# Patient Record
Sex: Male | Born: 1955
Health system: Southern US, Community
[De-identification: ages and names within clinical notes are randomized; demographics above are authoritative.]

## PROBLEM LIST (undated history)

## (undated) DIAGNOSIS — N390 Urinary tract infection, site not specified: Secondary | ICD-10-CM

## (undated) DIAGNOSIS — I10 Essential (primary) hypertension: Secondary | ICD-10-CM

## (undated) DIAGNOSIS — F819 Developmental disorder of scholastic skills, unspecified: Secondary | ICD-10-CM

## (undated) DIAGNOSIS — R569 Unspecified convulsions: Secondary | ICD-10-CM

## (undated) DIAGNOSIS — E039 Hypothyroidism, unspecified: Secondary | ICD-10-CM

## (undated) HISTORY — DX: Essential (primary) hypertension: I10

## (undated) HISTORY — DX: Unspecified convulsions: R56.9

## (undated) HISTORY — DX: Urinary tract infection, site not specified: N39.0

## (undated) HISTORY — PX: TONSILLECTOMY: SUR1361

## (undated) HISTORY — DX: Developmental disorder of scholastic skills, unspecified: F81.9

## (undated) HISTORY — DX: Hypothyroidism, unspecified: E03.9

---

## 2001-06-11 ENCOUNTER — Encounter: Payer: Self-pay | Admitting: Internal Medicine

## 2001-06-21 ENCOUNTER — Encounter: Payer: Self-pay | Admitting: Internal Medicine

## 2001-08-13 ENCOUNTER — Encounter: Payer: Self-pay | Admitting: Internal Medicine

## 2001-09-19 ENCOUNTER — Encounter: Payer: Self-pay | Admitting: Internal Medicine

## 2002-12-25 HISTORY — PX: VASECTOMY: SHX75

## 2005-09-24 DIAGNOSIS — R569 Unspecified convulsions: Secondary | ICD-10-CM

## 2005-09-24 HISTORY — DX: Unspecified convulsions: R56.9

## 2005-09-25 ENCOUNTER — Ambulatory Visit: Payer: Self-pay | Admitting: Internal Medicine

## 2005-10-04 ENCOUNTER — Encounter: Payer: Self-pay | Admitting: Internal Medicine

## 2005-10-27 ENCOUNTER — Encounter: Payer: Self-pay | Admitting: Internal Medicine

## 2006-06-14 ENCOUNTER — Ambulatory Visit: Payer: Self-pay | Admitting: Internal Medicine

## 2006-06-15 ENCOUNTER — Ambulatory Visit: Payer: Self-pay | Admitting: Cardiovascular Disease

## 2007-05-29 ENCOUNTER — Ambulatory Visit: Payer: Self-pay | Admitting: Internal Medicine

## 2007-05-29 DIAGNOSIS — F819 Developmental disorder of scholastic skills, unspecified: Secondary | ICD-10-CM | POA: Insufficient documentation

## 2007-05-29 DIAGNOSIS — E039 Hypothyroidism, unspecified: Secondary | ICD-10-CM | POA: Insufficient documentation

## 2007-05-29 DIAGNOSIS — F8189 Other developmental disorders of scholastic skills: Secondary | ICD-10-CM | POA: Insufficient documentation

## 2007-05-29 LAB — CONVERTED CEMR LAB
Bilirubin Urine: NEGATIVE
Glucose, Urine, Semiquant: NEGATIVE
Ketones, urine, test strip: NEGATIVE
Nitrite: POSITIVE
Protein, U semiquant: NEGATIVE
Specific Gravity, Urine: 1.015
Urobilinogen, UA: NEGATIVE
pH: 5

## 2007-06-06 ENCOUNTER — Encounter (INDEPENDENT_AMBULATORY_CARE_PROVIDER_SITE_OTHER): Payer: Self-pay | Admitting: *Deleted

## 2007-06-06 LAB — CONVERTED CEMR LAB: TSH: 5.5 microintl units/mL (ref 0.35–5.50)

## 2007-06-17 ENCOUNTER — Ambulatory Visit: Payer: Self-pay | Admitting: Internal Medicine

## 2007-06-18 ENCOUNTER — Encounter: Payer: Self-pay | Admitting: Internal Medicine

## 2007-06-18 LAB — CONVERTED CEMR LAB
Bilirubin Urine: NEGATIVE
Ketones, ur: NEGATIVE mg/dL
Nitrite: POSITIVE — AB
Protein, ur: >300 mg/dL — AB
Specific Gravity, Urine: 1.018 (ref 1.005–1.03)
Urine Glucose: NEGATIVE mg/dL
Urobilinogen, UA: 0.2 (ref 0.0–1.0)
pH: 7.5 (ref 5.0–8.0)

## 2007-07-03 ENCOUNTER — Ambulatory Visit: Payer: Self-pay | Admitting: Internal Medicine

## 2007-07-03 LAB — CONVERTED CEMR LAB
Bilirubin Urine: NEGATIVE
Glucose, Urine, Semiquant: NEGATIVE
Ketones, urine, test strip: NEGATIVE
Nitrite: POSITIVE
Protein, U semiquant: >=300
Specific Gravity, Urine: 1.01
Urobilinogen, UA: NEGATIVE
pH: 7.5

## 2007-07-04 ENCOUNTER — Encounter: Payer: Self-pay | Admitting: Internal Medicine

## 2007-07-04 LAB — CONVERTED CEMR LAB: Bacteria, UA: NONE SEEN

## 2007-07-08 ENCOUNTER — Encounter (INDEPENDENT_AMBULATORY_CARE_PROVIDER_SITE_OTHER): Payer: Self-pay | Admitting: *Deleted

## 2007-08-08 ENCOUNTER — Ambulatory Visit: Payer: Self-pay | Admitting: Internal Medicine

## 2007-08-08 LAB — CONVERTED CEMR LAB: WBC, UA: NONE SEEN cells/hpf (ref <3)

## 2007-08-09 ENCOUNTER — Encounter: Payer: Self-pay | Admitting: Internal Medicine

## 2007-08-12 ENCOUNTER — Encounter (INDEPENDENT_AMBULATORY_CARE_PROVIDER_SITE_OTHER): Payer: Self-pay | Admitting: *Deleted

## 2007-10-04 ENCOUNTER — Ambulatory Visit: Payer: Self-pay | Admitting: Internal Medicine

## 2008-03-16 ENCOUNTER — Ambulatory Visit: Payer: Self-pay | Admitting: Internal Medicine

## 2008-03-16 LAB — CONVERTED CEMR LAB
Glucose, Bld: 111 mg/dL
Hemoglobin: 14.5 g/dL

## 2008-03-20 ENCOUNTER — Ambulatory Visit: Payer: Self-pay | Admitting: Internal Medicine

## 2008-03-24 ENCOUNTER — Telehealth: Payer: Self-pay | Admitting: Internal Medicine

## 2008-03-24 LAB — CONVERTED CEMR LAB
BUN: 16 mg/dL
Basophils Absolute: 0.1 K/uL
Basophils Relative: 1.2 % — ABNORMAL HIGH
CO2: 33 meq/L — ABNORMAL HIGH
Calcium: 9.4 mg/dL
Chloride: 105 meq/L
Creatinine, Ser: 1.2 mg/dL
Eosinophils Absolute: 0.3 K/uL
Eosinophils Relative: 4.6 %
GFR calc Af Amer: 83 mL/min
GFR calc non Af Amer: 68 mL/min
Glucose, Bld: 84 mg/dL
HCT: 45 %
Hemoglobin: 15.1 g/dL
Lymphocytes Relative: 28.8 %
MCHC: 33.6 g/dL
MCV: 100.3 fL — ABNORMAL HIGH
Monocytes Absolute: 0.5 K/uL
Monocytes Relative: 7.9 %
Neutro Abs: 3.7 K/uL
Neutrophils Relative %: 57.5 %
Platelets: 247 K/uL
Potassium: 3.9 meq/L
RBC: 4.49 M/uL
RDW: 12.3 %
Sodium: 142 meq/L
TSH: 12.34 u[IU]/mL — ABNORMAL HIGH
WBC: 6.4 10*3/microliter

## 2008-07-03 ENCOUNTER — Encounter (INDEPENDENT_AMBULATORY_CARE_PROVIDER_SITE_OTHER): Payer: Self-pay | Admitting: *Deleted

## 2008-09-04 ENCOUNTER — Ambulatory Visit: Payer: Self-pay | Admitting: Internal Medicine

## 2008-09-04 DIAGNOSIS — D239 Other benign neoplasm of skin, unspecified: Secondary | ICD-10-CM | POA: Insufficient documentation

## 2008-09-05 ENCOUNTER — Encounter: Payer: Self-pay | Admitting: Internal Medicine

## 2008-09-07 ENCOUNTER — Encounter (INDEPENDENT_AMBULATORY_CARE_PROVIDER_SITE_OTHER): Payer: Self-pay | Admitting: *Deleted

## 2008-09-10 ENCOUNTER — Telehealth (INDEPENDENT_AMBULATORY_CARE_PROVIDER_SITE_OTHER): Payer: Self-pay | Admitting: *Deleted

## 2008-09-10 ENCOUNTER — Ambulatory Visit: Payer: Self-pay | Admitting: Internal Medicine

## 2008-09-14 ENCOUNTER — Telehealth (INDEPENDENT_AMBULATORY_CARE_PROVIDER_SITE_OTHER): Payer: Self-pay | Admitting: *Deleted

## 2008-09-14 LAB — CONVERTED CEMR LAB
ALT: 39 units/L (ref 0–53)
AST: 34 units/L (ref 0–37)
BUN: 13 mg/dL (ref 6–23)
CO2: 32 meq/L (ref 19–32)
Calcium: 9.5 mg/dL (ref 8.4–10.5)
Chloride: 103 meq/L (ref 96–112)
Cholesterol: 227 mg/dL (ref 0–200)
Creatinine, Ser: 1.2 mg/dL (ref 0.4–1.5)
Direct LDL: 161.9 mg/dL
Folate: 13.7 ng/mL
GFR calc Af Amer: 83 mL/min
GFR calc non Af Amer: 68 mL/min
Glucose, Bld: 87 mg/dL (ref 70–99)
HDL: 46.9 mg/dL (ref >39.0)
PSA: 3.44 ng/mL (ref 0.10–4.00)
Potassium: 4.1 meq/L (ref 3.5–5.1)
Sodium: 140 meq/L (ref 135–145)
TSH: 6.78 microintl units/mL — ABNORMAL HIGH (ref 0.35–5.50)
Total CHOL/HDL Ratio: 4.8
Triglycerides: 130 mg/dL (ref 0–149)
VLDL: 26 mg/dL (ref 0–40)
Vitamin B-12: 411 pg/mL (ref 211–911)

## 2008-11-23 ENCOUNTER — Encounter (INDEPENDENT_AMBULATORY_CARE_PROVIDER_SITE_OTHER): Payer: Self-pay | Admitting: *Deleted

## 2008-12-15 ENCOUNTER — Ambulatory Visit: Payer: Self-pay | Admitting: Internal Medicine

## 2008-12-24 ENCOUNTER — Encounter (INDEPENDENT_AMBULATORY_CARE_PROVIDER_SITE_OTHER): Payer: Self-pay | Admitting: *Deleted

## 2008-12-24 LAB — CONVERTED CEMR LAB: Fecal Occult Bld: NEGATIVE

## 2009-05-25 ENCOUNTER — Encounter (INDEPENDENT_AMBULATORY_CARE_PROVIDER_SITE_OTHER): Payer: Self-pay | Admitting: *Deleted

## 2009-08-11 ENCOUNTER — Ambulatory Visit: Payer: Self-pay | Admitting: Internal Medicine

## 2009-08-13 ENCOUNTER — Telehealth (INDEPENDENT_AMBULATORY_CARE_PROVIDER_SITE_OTHER): Payer: Self-pay | Admitting: *Deleted

## 2009-08-13 LAB — CONVERTED CEMR LAB: TSH: 3.87 microintl units/mL (ref 0.35–5.50)

## 2010-01-28 ENCOUNTER — Encounter: Payer: Self-pay | Admitting: Internal Medicine

## 2010-04-12 ENCOUNTER — Telehealth (INDEPENDENT_AMBULATORY_CARE_PROVIDER_SITE_OTHER): Payer: Self-pay | Admitting: *Deleted

## 2010-04-13 ENCOUNTER — Encounter (INDEPENDENT_AMBULATORY_CARE_PROVIDER_SITE_OTHER): Payer: Self-pay | Admitting: *Deleted

## 2010-04-25 ENCOUNTER — Encounter (INDEPENDENT_AMBULATORY_CARE_PROVIDER_SITE_OTHER): Payer: Self-pay | Admitting: *Deleted

## 2010-04-25 ENCOUNTER — Ambulatory Visit: Payer: Self-pay | Admitting: Internal Medicine

## 2010-05-02 ENCOUNTER — Ambulatory Visit: Payer: Self-pay | Admitting: Internal Medicine

## 2010-05-04 ENCOUNTER — Telehealth (INDEPENDENT_AMBULATORY_CARE_PROVIDER_SITE_OTHER): Payer: Self-pay | Admitting: *Deleted

## 2010-05-04 LAB — CONVERTED CEMR LAB: TSH: 5.56 microintl units/mL — ABNORMAL HIGH (ref 0.35–5.50)

## 2010-06-13 ENCOUNTER — Ambulatory Visit: Payer: Self-pay | Admitting: Internal Medicine

## 2010-06-15 LAB — CONVERTED CEMR LAB: TSH: 2.18 microintl units/mL (ref 0.35–5.50)

## 2010-06-30 ENCOUNTER — Ambulatory Visit: Payer: Self-pay | Admitting: Internal Medicine

## 2011-01-02 ENCOUNTER — Encounter: Payer: Self-pay | Admitting: Internal Medicine

## 2011-01-22 LAB — CONVERTED CEMR LAB
Bilirubin Urine: NEGATIVE
Glucose, Urine, Semiquant: NEGATIVE
Ketones, urine, test strip: NEGATIVE
Nitrite: NEGATIVE
Protein, U semiquant: NEGATIVE
Specific Gravity, Urine: 1.02
Urobilinogen, UA: 0.2
pH: 6

## 2011-01-24 NOTE — Letter (Signed)
Summary: [facility]  [facility]   Imported By: Lanelle Bal 07/05/2010 11:29:40  _____________________________________________________________________  External Attachment:    Type:   Image     Comment:   External Document

## 2011-01-24 NOTE — Letter (Signed)
Summary: Results Follow up Letter  [place] [facility] [place]  [address] [place], [place] [address]   Phone: [phone]  Fax: [phone]    08/12/2007 MRN: [id number]  Dean Dennis [address] [place], [place]  [address]  Dear Mr. Cadman,  The following are the results of your recent test(s):  Test         Result    Pap Smear:        Normal _____  Not Normal _____ Comments: ______________________________________________________ Cholesterol: LDL(Bad cholesterol):         Your goal is less than:         HDL (Good cholesterol):       Your goal is more than: Comments:  ______________________________________________________ Mammogram:        Normal _____  Not Normal _____ Comments:  ___________________________________________________________________ Hemoccult:        Normal _____  Not normal _______ Comments:    _____________________________________________________________________ Other Tests: Your urine culture was normal.  We routinely do not discuss normal results over the telephone.  If you desire a copy of the results, or you have any questions about this information we can discuss them at your next office visit.   Sincerely,

## 2011-01-24 NOTE — Letter (Signed)
Summary: Results Follow up Letter  [place] [facility]  [address] [place], [place] [address]   Phone: [phone]  Fax: [phone]    12/24/2008 MRN: [id number]  Dean Dennis [address] [place], [place]  [address]  Dear Mr. Bovey,  The following are the results of your recent test(s):  Test         Result    Pap Smear:        Normal _____  Not Normal _____ Comments: ______________________________________________________ Cholesterol: LDL(Bad cholesterol):         Your goal is less than:         HDL (Good cholesterol):       Your goal is more than: Comments:  ______________________________________________________ Mammogram:        Normal _____  Not Normal _____ Comments:  ___________________________________________________________________ Hemoccult:        Normal ___x__  Not normal _______ Comments:    _____________________________________________________________________ Other Tests:    We routinely do not discuss normal results over the telephone.  If you desire a copy of the results, or you have any questions about this information we can discuss them at your next office visit.   Sincerely,

## 2011-01-24 NOTE — Miscellaneous (Signed)
Summary: Health Care Power of [facility] Power of Attorney   Imported By: Lanelle Bal 07/05/2010 11:33:31  _____________________________________________________________________  External Attachment:    Type:   Image     Comment:   External Document

## 2011-01-24 NOTE — Progress Notes (Signed)
Summary: not taking synthroid  Phone Note Outgoing Call   Details for Reason: **LAB RESULTS - SEE APPEND** -cbc ok -sugar ok -need to increase synthroid from 50-100 -f/u with dr Drue Novel in 3 mos Summary of Call: spoke with mom - patient has not been taking synthroid x 6 months - do you want him to start back on the ? [facility] [place] ..................................................................Marland KitchenShary Decamp  March 24, 2008 11:53 AM yes, once daily . Need medicine for the rest of his life OV 3 months....................................................................Jose E. Paz MD  March 24, 2008 2:26 PM   Initial call taken by: Nolon Rod. Paz MD,  March 24, 2008 2:25 PM      Prescriptions: SYNTHROID 50 MCG  TABS (LEVOTHYROXINE SODIUM) 1 by mouth qd  #30 x 3   Entered by:   Shary Decamp   Authorized by:   Nolon Rod. Paz MD   Signed by:   Shary Decamp on 03/24/2008   Method used:   Electronically sent to ...       [facility]  [address] [id number]*       [address]       [place], [place]  [address]       Ph: [phone] or [phone]       Fax: [phone]   RxID:   [id number]

## 2011-01-24 NOTE — Progress Notes (Signed)
Summary: lab results  Phone Note Outgoing Call Call back at Home Phone [phone]   Reason for Call: Discuss lab or test results Details for Reason: if he has not been compliant with Synthroid, he needs to be compliant otherwise increase Synthroid from 100 micrograms to 125 micrograms daily TSH in two months Signed by [facility] E. Paz MD on 05/04/2010 at 2:00 PM  Summary of Call: left message on machine for pt to return call spoke with pt sister *teresa" [phone] -- she will check with pt Shary Decamp  May 04, 2010 4:48 PM Patient has only been taking medication days a week, advised pt to take everyday......discussed with sister.  Advised recheck in 6 weeks Shary Decamp  May 05, 2010 11:34 AM

## 2011-01-24 NOTE — Letter (Signed)
Summary: [facility]  [address] [place], [place] [address]   Phone: [phone]  Fax: [phone]    [facility] [address] [place], [place] [address]     To: [facility] [employer]  From: Willow Ora, MD  07/03/2008  Patient's Name: Lenna Gilford Date of Birth: 1956/06/11   The above patient is physically or mentally unable to perform jury duty.   Please feel free to contact our office if you have any questions [phone]).    Sincerely,     Willow Ora, MD

## 2011-01-24 NOTE — Assessment & Plan Note (Signed)
Summary: roa//lch   Vital Signs:  Patient profile:   55 year old male Height:      69 inches Weight:      169.2 pounds BMI:     25.08 Pulse rate:   66 / minute BP sitting:   122 / 84  Vitals Entered By: Shary Decamp (May 02, 2010 11:14 AM) CC: rov   History of Present Illness: ROV no concerns   Current Medications (verified): 1)  Synthroid 100 Mcg Tabs (Levothyroxine Sodium) .... Take One Tablet Daily 2)  Aspirin 81 Mg Tbec (Aspirin) .Marland Kitchen.. 1  A  Day  Allergies (verified): No Known Drug Allergies  Past History:  Past Medical History: LEARNING DISABILITY--evaluated in the past by psych  "borderline functioning range of intelligence" HYPOTHYROIDISM (ICD-244.9) 09-2005 ?SZ--saw neuro, they didn't think he had a sz, (-) EEG  Past Surgical History: Reviewed history from 05/29/2007 and no changes required. Tonsillectomy  Social History: Reviewed history from 05/29/2007 and no changes required. Single lost his mother aprox 2 years ago , lives by himself, sisters check on him has 2 sisters: [name] [phone]), [name] doesn't work ADL-- cooks his own meals, does drive plays the banjo  Review of Systems       good medication compliance w/ throid medicine , needs 1 year rf saw urology d/t h/o a UTI 2 years ago, told he was ok lost mother, see SH denies anxiety depression  Physical Exam  General:  alert and well-developed.   Lungs:  normal respiratory effort, no intercostal retractions, no accessory muscle use, and normal breath sounds.   Heart:  normal rate, regular rhythm, no murmur, and no gallop.   Psych:  not anxious appearing and not depressed appearing.     Impression & Recommendations:  Problem # 1:  HYPOTHYROIDISM (ICD-244.9) due for labs , will need a 1 year refill when due   His updated medication list for this problem includes:    Synthroid 100 Mcg Tabs (Levothyroxine sodium) .Marland Kitchen... Take one tablet daily  Orders: Venipuncture ([address]) TLB-TSH  (Thyroid Stimulating Hormone) (84443-TSH)  Complete Medication List: 1)  Synthroid 100 Mcg Tabs (Levothyroxine sodium) .... Take one tablet daily 2)  Aspirin 81 Mg Tbec (Aspirin) .Marland Kitchen.. 1  a  day  Patient Instructions: 1)  Please schedule a follow-up appointment in 4 months .

## 2011-01-24 NOTE — Letter (Signed)
Summary: Primary Care Appointment Letter  [place] [facility]  [address] [place], [place] [address]   Phone: [phone]  Fax: [phone]    04/13/2010 MRN: [id number]  Dean Dennis [address] [place], [place]  [address]  Dear [name],   Your Primary Care Physician [place] E. Paz MD has indicated that:    __X_____it is time to schedule an appointment.    _______you missed your appointment on______ and need to call and          reschedule.    __X_____you need to have lab work done.    _______you need to schedule an appointment discuss lab or test results.    _______you need to call to reschedule your appointment that is                       scheduled on _________.     Please call our office as soon as possible. Our phone number is [phone]________. Please press option 1. Our office is open 8a-12noon and 1p-5p, Monday through Friday.     Thank you,    [place] Primary Care Scheduler

## 2011-01-24 NOTE — Letter (Signed)
Summary: Primary Care Consult Scheduled Letter  [place] [facility]  [address] [place], [place] [address]   Phone: [phone]  Fax: [phone]      09/07/2008 MRN: [id number]  Dean Dennis [address] [place], [place]  [address]    Dear Mr. Borge,      We have scheduled an appointment for you.  At the recommendation of Dr.Paz, we have scheduled you a consult with Dr. Charlton Haws on September 28th at 12:00.  Their address is [address]. The office phone number is [phone].  If this appointment day and time is not convenient for you, please feel free to call the office of the doctor you are being referred to at the number listed above and reschedule the appointment.     It is important for you to keep your scheduled appointments. We are here to make sure you are given good patient care. If you have questions or you have made changes to your appointment, please notify us at  [phone], ask for Tiffany.    Thank you,  Patient Care Coordinator [place] [facility]

## 2011-01-24 NOTE — Assessment & Plan Note (Signed)
Summary: roa after medicine//lch   Vital Signs:  Patient profile:   55 year old male Weight:      165.25 pounds Pulse rate:   57 / minute Pulse rhythm:   regular BP sitting:   134 / 86  (left arm) Cuff size:   large  Vitals Entered By: Army Fossa CMA (June 30, 2010 10:59 AM) CC: Pt here to follow up on meds. Has had recent TSH.  Comments Pt would like a year supply for his synthroid   History of Present Illness: here w/  sister Campbell Lerner doing well needs refill  also , she is trying to get guardianship of [place], needs records from years ago when he was eval for learning disability  Allergies (verified): No Known Drug Allergies  Past History:  Past Medical History: POA: sisters [name] and [name] LEARNING DISABILITY--evaluated in the past by psych  "borderline functioning range of intelligence" HYPOTHYROIDISM (ICD-244.9) 09-2005 ?SZ--saw neuro, they didn't think he had a sz, (-) EEG  Past Surgical History: Reviewed history from 05/29/2007 and no changes required. Tonsillectomy  Family History: MI-- F age 42 prostate ca--no colon ca--no DM--no lung cancer-- M , remote smoker   Social History: Single lost his mother aprox 2 years ago , lives by himself, sisters check on him has 2 sisters: [name] [phone]), Campbell Lerner [phone]) doesn't work ADL-- cooks his own meals, does drive plays the banjo  Review of Systems       good compliance with Synthroid  Physical Exam  General:  alert and well-developed.   Neurologic:  at baseline Psych:  at baseline   Impression & Recommendations:  Problem # 1:  HYPOTHYROIDISM (ICD-244.9) well controlled at present, recheck and return to the office His updated medication list for this problem includes:    Synthroid 100 Mcg Tabs (Levothyroxine sodium) .Marland Kitchen... Take one tablet daily  Labs Reviewed: TSH: 2.18 (06/13/2010)    Chol: 227 (09/10/2008)   HDL: 46.9 (09/10/2008)   LDL: DEL (09/10/2008)   TG: 130  (09/10/2008)  Problem # 2:  LEARNING DISABILITY (ICD-315.2) the sisters like guardianship  of Ranvir, he is now seeing a psychologist and they need to review previous evaluations. Paper chart is reviewed, we found records from 2002. They were copied and provided to [facility], will also scann them   Problem # 3:  time spent more than 15 minutes mostly due to counseling and coordinating his care (getting old records)  Complete Medication List: 1)  Synthroid 100 Mcg Tabs (Levothyroxine sodium) .... Take one tablet daily 2)  Aspirin 81 Mg Tbec (Aspirin) .Marland Kitchen.. 1  a  day  Patient Instructions: 1)  please came back in October, fasting, will do your physical exam Prescriptions: SYNTHROID 100 MCG TABS (LEVOTHYROXINE SODIUM) take one tablet daily  #90 x 3   Entered and Authorized by:   Nolon Rod. Satara Virella MD   Signed by:   Nolon Rod. Maveryk Renstrom MD on 06/30/2010   Method used:   Electronically to        [id number] (retail)       [address]       [place], [place]  [address]       Ph: [phone] or [phone]       Fax: [phone]   RxID:   [id number]

## 2011-01-24 NOTE — Progress Notes (Signed)
Summary: lab results  Phone Note Outgoing Call Call back at [facility] Phone [phone]   Details for Reason: LAB RESULTS: discuss with patient's family cholesterol is moderately elevated, diet and exercise recommended TSH is slightly elevated, needs more centrally.  The patient is currently on 50  micrograms, increase to , check a TSH in 6 weeks PSA normal, no previous PSAs rest of the labs are normal Signed by [facility] E. Paz MD on 09/13/2008 at 11:10 AM Summary of Call: new synthroid prescription called to pharmacy left message on machine for mom (hilda) to return call .........Marland KitchenShary Decamp  September 14, 2008 3:18 PM  discussed with mom ..........Marland KitchenShary Decamp  September 15, 2008 9:15 AM      New/Updated Medications: SYNTHROID 75 MCG TABS (LEVOTHYROXINE SODIUM) 1 by mouth QD   Prescriptions: SYNTHROID 75 MCG TABS (LEVOTHYROXINE SODIUM) 1 by mouth QD  #30 x 2   Entered by:   Shary Decamp   Authorized by:   Nolon Rod. Paz MD   Signed by:   Shary Decamp on 09/14/2008   Method used:   Electronically to        [facility] # [id number]* (retail)       [address]       [place], [place]  [address]       Ph: [phone] or [phone]       Fax: [phone]   RxID:   [id number]

## 2011-01-24 NOTE — Progress Notes (Signed)
Summary: tsh results  Phone Note Outgoing Call Call back at [facility] Phone [phone]   Summary of Call: TSH RESULTS: d/w patient iand family: ncreaase dose from 75 to once daily  TSH in 6 weeks (or on RTC)   Signed by Erwin E. Paz MD on 08/12/2009 at 1:37 PM  Follow-up for Phone Call        lmom for pt to return call Shary Decamp  August 13, 2009 5:23 PM  discussed with pt Shary Decamp  August 16, 2009 2:46 PM

## 2011-01-24 NOTE — Letter (Signed)
Summary: Generic Letter  [place] [facility]  [address] [place], [place] [address]   Phone: [phone]  Fax: [phone]    07/08/2007  Dean Dennis [address] [place], [place]  [address]   Dear [name],   Dr. Drue Novel has requested that you repeat your labs in 4 weeks.  I have scheduled you a lab appointment for Thursday 8/14  @ 11am .  If this time is not convenient please call [phone] and speak to our scheduler to reschedule this appointment.       Sincerely,   Shary Decamp [place] [facility]

## 2011-01-24 NOTE — Consult Note (Signed)
Summary: [facility]  [facility]   Imported By: Lanelle Bal 07/05/2010 11:25:13  _____________________________________________________________________  External Attachment:    Type:   Image     Comment:   External Document

## 2011-01-24 NOTE — Letter (Signed)
Summary: Results Follow up Letter  [place] [facility]  [address] [place], [place] [address]   Phone: [phone]  Fax: [phone]    06/06/2007 MRN: [id number]   [facility]  Dean Dennis [address] [place], [place]  [address]    Dear [name],  The following are the results of your recent test(s):  Test         Result    Pap Smear:        Normal _____  Not Normal _____ Comments: ______________________________________________________ Cholesterol: LDL(Bad cholesterol):         Your goal is less than:         HDL (Good cholesterol):       Your goal is more than: Comments:  ______________________________________________________ Mammogram:        Normal _____  Not Normal _____ Comments:  ___________________________________________________________________ Hemoccult:        Normal _____  Not normal _______ Comments:    _____________________________________________________________________ Other Tests: Please come back in 4 weeks to recheck your urine culture.  please return in 4 months to recheck your thyroid.  Continue your same dose of thyroid medication.  Appointment information enclosed.  If this appointment time is not good please call our scheduler to reschedule your appointments.   We routinely do not discuss normal results over the telephone.  If you desire a copy of the results, or you have any questions about this information we can discuss them at your next office visit.   Sincerely,

## 2011-01-24 NOTE — Letter (Signed)
Summary: [facility]  [address] [place], [place] [address]   Phone: [phone]  Fax: [phone]    To: [facility]  From: Willow Ora, MD  07/03/2008  Patient's Name: Lenna [place] Date of Birth: 06/19/1956   The above patient is physically or mentally unable to perform jury duty.  Feel free to contact our office with any questions.    Sincerely,    Willow Ora, MD    Appended Document: [facility] [facility] Letter mailed to pt

## 2011-01-24 NOTE — Consult Note (Signed)
Summary: [facility]  [facility]   Imported By: Lanelle Bal 07/05/2010 11:30:55  _____________________________________________________________________  External Attachment:    Type:   Image     Comment:   External Document

## 2011-01-24 NOTE — Letter (Signed)
Summary: Primary Care Appointment Letter  [place] [facility]  [address] [place], [place] [address]   Phone: [phone]  Fax: [phone]    05/25/2009 MRN: [id number]  Dean Dennis [address] [place], [place]  [address]  Dear [name],   Your Primary Care Physician Rossie E. Paz MD has indicated that:    _______it is time to schedule an appointment.    _______you missed your appointment on______ and need to call and          reschedule.    ___X____you need to have lab work done.    _______you need to schedule an appointment discuss lab or test results.    _______you need to call to reschedule your appointment that is                       scheduled on _________.     Please call our office as soon as possible. Our phone number is [phone]          _________. Please press option 1. Our office is open 8a-12noon and 1p-5p, Monday through Friday.     Thank you,    [place] Primary Care Scheduler

## 2011-01-24 NOTE — Assessment & Plan Note (Signed)
Summary: u/a ck/alr  Nurse Visit   Prior Medications: SYNTHROID 50 MCG  TABS (LEVOTHYROXINE SODIUM) 1 by mouth qd CIPRO 500 MG TABS (CIPROFLOXACIN HCL) one p.o. b.i.d. Current Allergies: No known allergies  Laboratory Results   Urine Tests   Date/Time Reported:          Orders Added: 1)  T-Culture, Urine [[id number]] 2)  T-Urinalysis [[id number]]

## 2011-01-24 NOTE — Assessment & Plan Note (Signed)
Summary: shakey if doesnt eat.cbs   Vital Signs:  Patient Profile:   55 Years Old Male Weight:      168 pounds Temp:     99 degrees F oral BP sitting:   142 / 80  Vitals Entered By: Shary Decamp (March 16, 2008 11:31 AM)                 Chief Complaint:  lightheaded x 2 days; no other sxs.  History of Present Illness: got lightheaded, dizzy, irritable (anxious) ; sx lasted until he ate some crackers similar episodes x 2  over the last few months? low sugar?     Current Allergies: No known allergies   Past Medical History:    Reviewed history from 05/29/2007 and no changes required:              LEARNING DISABILITY (ICD-315.2)       HYPOTHYROIDISM (ICD-244.9)          Social History:    Reviewed history from 05/29/2007 and no changes required:       Single       lives with his mother    Review of Systems       denies CP-palpitations No recent LOC no tongue bite or seizures   Physical Exam  General:     alert and well-developed.   Lungs:     normal respiratory effort, no intercostal retractions, no accessory muscle use, and normal breath sounds.   Heart:     normal rate, regular rhythm, and no murmur.   Neurologic:     Alert--cooperative--coherentstrength normal in all extremities and gait normal.  No tremors Psych:     not anxious appearing and not depressed appearing.      Impression & Recommendations:  Problem # 1:  DIZZINESS (ICD-780.4) episodes of dizzines pt's mother  concern about his sugar  (CBG normal) Question of SZs when he was a teen, used to be on phenobarbital Rec-- observe, labs, call if worse Will discuss w/ mom--will ask her to keep [place] informed anxiety? Orders: TLB-TSH (Thyroid Stimulating Hormone) (84443-TSH) TLB-BMP (Basic Metabolic Panel-BMET) (80048-METABOL)   Problem # 2:  HYPOTHYROIDISM (ICD-244.9)  His updated medication list for this problem includes:    Synthroid 50 Mcg Tabs (Levothyroxine sodium) .Marland Kitchen... 1 by  mouth qd  Orders: TLB-CBC Platelet - w/Differential (85025-CBCD) Venipuncture ([address])   Complete Medication List: 1)  Synthroid 50 Mcg Tabs (Levothyroxine sodium) .Marland Kitchen.. 1 by mouth qd   Patient Instructions: 1)  Please schedule a follow-up appointment in 3 months.    ] Laboratory Results   Blood Tests     Glucose (random): 111 mg/dL   (Normal Range: 09-811)  CBC HGB:  14.5 g/dL   (Normal Range: 91.4-78.2 in Males, 12.0-15.0 in Females)    Appended Document: shakey if doesnt eat.cbs spoke with mom patient did not have lab work when he was here in the office patient will return for lab work mom will keep [place] posted

## 2011-01-24 NOTE — Progress Notes (Signed)
Summary: due ov  Phone Note Outgoing Call   Summary of Call: I refilled his meds x 1 only.  Due office visit & labs Shary Decamp  April 12, 2010 12:59 PM     Additional Follow-up for Phone Call Additional follow up Details #2::    LMTCB Follow-up by: Barb Merino,  April 12, 2010 1:04 PM  Additional Follow-up for Phone Call Additional follow up Details #3:: Details for Additional Follow-up Action Taken: MAILED A LETTER Additional Follow-up by: Barb Merino,  April 13, 2010 3:22 PM

## 2011-01-24 NOTE — Miscellaneous (Signed)
Summary: Continuing [employer] of [employer] Power of Attorney   Imported By: Lanelle Bal 07/05/2010 11:35:15  _____________________________________________________________________  External Attachment:    Type:   Image     Comment:   External Document

## 2011-01-24 NOTE — Progress Notes (Signed)
Summary: urine culture results  Phone Note Outgoing Call   Details for Reason: URINE CULTURE RESULTS: UA showed pyuria  although urine culture is essentially negative, the patient has urinary symptoms and pyuria.  Will prescribe Cipro 500 mg b.i.d. for 10 days. Proceed with urology referral notify the patient Signed by [facility] E. Paz MD on 09/10/2008 at 1:17 PM  Summary of Call: DISCUSSED WITH MOM REFERRAL DONE RX CALLED ...........Marland KitchenShary Decamp  September 10, 2008 1:18 PM     New/Updated Medications: CIPRO 500 MG TABS (CIPROFLOXACIN HCL) bid   Prescriptions: CIPRO 500 MG TABS (CIPROFLOXACIN HCL) bid  #20 x 0   Entered by:   Shary Decamp   Authorized by:   Nolon Rod. Paz MD   Signed by:   Shary Decamp on 09/10/2008   Method used:   Electronically to        [facility] # [id number]* (retail)       [address]       [place], [place]  [address]       Ph: [phone] or [phone]       Fax: [phone]   RxID:   [id number]

## 2011-01-24 NOTE — Assessment & Plan Note (Signed)
Summary: CPX//vgj   Vital Signs:  Patient Profile:   55 Years Old Male Weight:      174 pounds Pulse rate:   66 / minute BP sitting:   136 / 80  Vitals Entered By: Shary Decamp (September 04, 2008 1:17 PM)                 Chief Complaint:  Likes a CPX  -- NEEDS THYROID CHECKED/CHECK MOLES; PT STATES THAT HE FEELS BETTER WHEN HE DOESN'T TAKE HIS THYROID MEDICATION -- FEELS SLUGGISH & HAS GAINED WT.  History of Present Illness: here w/ sister they like a yearly check and have several issues...  CHECK MOLES PT STATES THAT HE FEELS BETTER WHEN HE DOESN'T TAKE HIS THYROID MEDICATION FEELS SLUGGISH --off/on x  a while HAS GAINED WT.-- states doesn't exercise, but active; diet not healthy       Updated Prior Medication List: SYNTHROID 50 MCG  TABS (LEVOTHYROXINE SODIUM) 1 by mouth qd  Current Allergies (reviewed today): No known allergies   Past Medical History:    Reviewed history from 03/16/2008 and no changes required:       LEARNING DISABILITY--evaluated in the past by psych  "borderline functioning rangeof intelligence"       HYPOTHYROIDISM (ICD-244.9)       09-2005 ?SZ--saw neuro, they didn't think he had a sz, (-) EEG         Past Surgical History:    Reviewed history from 05/29/2007 and no changes required:       Tonsillectomy   Family History:    Reviewed history and no changes required:       MI-- F age 12       prostate ca--no       colon ca--no       DM--no  Social History:    Reviewed history from 05/29/2007 and no changes required:       Single       lives with his mother   Risk Factors: Tobacco use:  never Alcohol use:  no   Review of Systems  CV      Denies chest pain or discomfort and swelling of feet.  Resp      Denies cough and shortness of breath.  GI      Denies bloody stools, diarrhea, and nausea.  GU      Denies hematuria, urinary frequency, and urinary hesitancy.  Psych      Denies anxiety, depression, easily  angered, and easily tearful.   Physical Exam  General:     alert and well-developed.   Neck:     no thyromegaly and normal carotid upstroke.   Lungs:     normal respiratory effort, no intercostal retractions, no accessory muscle use, and normal breath sounds.   Heart:     normal rate, regular rhythm, no murmur, and no gallop.   Abdomen:     soft, non-tender, no hepatomegaly, and no splenomegaly.   Rectal:     No external abnormalities noted. Normal sphincter tone. No rectal masses or tenderness. hemocult neg Prostate:     Prostate gland firm and smooth, no enlargement, nodularity, tenderness, mass, asymmetry or induration. Extremities:     no pretibial edema bilaterally     Impression & Recommendations:  Problem # 1:  HYPOTHYROIDISM (ICD-244.9) labs His updated medication list for this problem includes:    Synthroid 50 Mcg Tabs (Levothyroxine sodium) .Marland Kitchen... 1 by mouth qd   Problem # 2:  HEALTH  SCREENING (ICD-V70.0) diet-exercise labs EKG--sinus brady Td--today Colonoscopy Vs. iFOB: reviewed w/ pt. Will provide iFOB  in case he decide to doit, otherwise, he will call and ask for a colonoscopy  flu shot this winter recommended ASA due to FH of CAD Orders: EKG w/ Interpretation (93000)   Problem # 3:  WEIGHT GAIN (ICD-783.1) diet- exercise labs  Problem # 4:  UTI (ICD-599.0) E. Coli UTI 6-07 now  UA c/w UTI pt is Asx  treat w/  culture referal to urology due to asx UTIs Orders: UA Dipstick w/o Micro (manual) ([address]) T-Culture, Urine ([id number]) T-Urine Microscopic ([id number]) Urology Referral (Urology)   Problem # 5:  MOLE (ICD-216.9) referal Orders: Dermatology Referral (Derma)   Complete Medication List: 1)  Synthroid 50 Mcg Tabs (Levothyroxine sodium) .Marland Kitchen.. 1 by mouth qd 2)  Aspirin 81 Mg Tbec (Aspirin) .Marland Kitchen.. 1  a  day  Other Orders: Tetanus Toxoid w/Dx [phone]) Admin 1st Vaccine ([address]846)   Patient Instructions: 1)  came back fasting: 2)    BMP AST ALT--Weight gain 3)   FLP PSA   B12 / Folic Acid--V70 4)  TSH--hypothyroid 5)  Please schedule a follow-up appointment in 4 months.   ]  Tetanus/Td Vaccine    Vaccine Type: Td    Site: right deltoid    Mfr: GlaxoSmithKline    Dose: 0.5 ml    Route: IM    Given by: Shary Decamp    Exp. Date: 11/28/2010    Lot #: [id number]  Laboratory Results   Urine Tests    Routine Urinalysis   Glucose: negative   (Normal Range: Negative) Bilirubin: negative   (Normal Range: Negative) Ketone: negative   (Normal Range: Negative) Spec. Gravity: 1.020   (Normal Range: 1.003-1.035) Blood: large   (Normal Range: Negative) pH: 6.0   (Normal Range: 5.0-8.0) Protein: negative   (Normal Range: Negative) Urobilinogen: 0.2   (Normal Range: 0-1) Nitrite: negative   (Normal Range: Negative) Leukocyte Esterace: moderate   (Normal Range: Negative)

## 2011-01-24 NOTE — Miscellaneous (Signed)
Summary: Living Will  Living Will   Imported By: Lanelle Bal 07/05/2010 11:36:09  _____________________________________________________________________  External Attachment:    Type:   Image     Comment:   External Document

## 2011-01-24 NOTE — Letter (Signed)
Summary: [facility] No Show Letter  [facility] [place]  [address] [place], [place] [address]   Phone: [phone]  Fax: [phone]    04/25/2010 MRN: [id number]  Dean Dennis [address] [place], [place]  [address]   Dear [name],   Our records indicate that you missed your scheduled appointment with Dr. Drue Novel on 04/25/10.  Please contact this office to reschedule your appointment as soon as possible.  It is important that you keep your scheduled appointments with your physician, so we can provide you the best care possible.  Please be advised that there may be a charge for "no show" appointments.    Sincerely,   [facility]

## 2011-01-26 NOTE — Letter (Signed)
Summary: dx UTI, increased PSA--- [facility]   Imported By: Lanelle Bal 01/11/2011 12:34:26  _____________________________________________________________________  External Attachment:    Type:   Image     Comment:   External Document

## 2011-06-19 ENCOUNTER — Other Ambulatory Visit: Payer: Self-pay | Admitting: Internal Medicine

## 2011-06-19 NOTE — Telephone Encounter (Signed)
Pt is due for appt before any additional refills will be given.

## 2011-06-22 NOTE — Telephone Encounter (Signed)
LMOM for patient to call and schedule a followup appt to see Dr Drue Novel

## 2011-06-23 NOTE — Telephone Encounter (Signed)
Has appt for 7/3 at 9:15

## 2011-06-27 ENCOUNTER — Ambulatory Visit (INDEPENDENT_AMBULATORY_CARE_PROVIDER_SITE_OTHER): Payer: Medicare Other | Admitting: Internal Medicine

## 2011-06-27 ENCOUNTER — Encounter: Payer: Self-pay | Admitting: Internal Medicine

## 2011-06-27 ENCOUNTER — Other Ambulatory Visit: Payer: Self-pay | Admitting: Internal Medicine

## 2011-06-27 DIAGNOSIS — E785 Hyperlipidemia, unspecified: Secondary | ICD-10-CM

## 2011-06-27 DIAGNOSIS — E039 Hypothyroidism, unspecified: Secondary | ICD-10-CM

## 2011-06-27 DIAGNOSIS — Z0000 Encounter for general adult medical examination without abnormal findings: Secondary | ICD-10-CM | POA: Insufficient documentation

## 2011-06-27 NOTE — Patient Instructions (Signed)
CAME BACK IN 4 MONTHS, FASTING FOR A PHYSICAL EXAM

## 2011-06-27 NOTE — Assessment & Plan Note (Signed)
Last physical exam 2009, recommended to return to the office in 4 months for a physical.

## 2011-06-27 NOTE — Progress Notes (Signed)
  Subjective:    Patient ID: Dean Dennis, male    DOB: [birth year], 55 y.o.   MRN: [id number]  HPI Routine office visit, no complains, needs his Synthroid refill  Past Medical History  Diagnosis Date  . Learning disability     evaluated in the past by psych "borderline functioning range of inttelligence  . Hypothyroidism   . Seizure 09/2005    saw neuro, they didnt think he had sz (-) EEG    Past Surgical History  Procedure Date  . Tonsillectomy     Family History:       MI-- F age 39       prostate ca--no       colon ca--no       DM--no   Social History: Single lost his mother aprox 2009 , lives by himself, sisters check on him: they are his POA sisters: [name] [phone]), Campbell Lerner [phone]) doesn't work ADL-- cooks his own meals, does drive plays the banjo  Review of Systems Good medication compliance with Synthroid Denies any nausea, vomiting, diarrhea. No blood in the stools    Objective:   Physical Exam Alert, no apparent distress, cooperative. Lungs: Clear to auscultation bilaterally Cardiovascular: Regular rhythm without a murmur Extremities: No edema       Assessment & Plan:

## 2011-06-27 NOTE — Assessment & Plan Note (Signed)
Good medication compliance, labs 

## 2011-06-29 ENCOUNTER — Other Ambulatory Visit (INDEPENDENT_AMBULATORY_CARE_PROVIDER_SITE_OTHER): Payer: Medicare Other

## 2011-06-29 DIAGNOSIS — E039 Hypothyroidism, unspecified: Secondary | ICD-10-CM

## 2011-06-29 DIAGNOSIS — E785 Hyperlipidemia, unspecified: Secondary | ICD-10-CM

## 2011-06-29 LAB — LIPID PANEL
Cholesterol: 169 mg/dL (ref 0–200)
HDL: 46.1 mg/dL (ref >39.00)
LDL Cholesterol: 99 mg/dL (ref 0–99)
Total CHOL/HDL Ratio: 4
Triglycerides: 119 mg/dL (ref 0.0–149.0)
VLDL: 23.8 mg/dL (ref 0.0–40.0)

## 2011-06-29 LAB — TSH: TSH: 2.01 u[IU]/mL (ref 0.35–5.50)

## 2011-06-29 NOTE — Progress Notes (Signed)
Labs only

## 2011-07-04 ENCOUNTER — Telehealth: Payer: Self-pay | Admitting: *Deleted

## 2011-07-04 NOTE — Telephone Encounter (Signed)
Message copied by Leanne Lovely on Tue Jul 04, 2011 10:47 AM ------      Message from: Dean Dennis      Created: Mon Jul 03, 2011  8:21 PM       Advise patient:      Thyroid ok, continue same synthroid      FLP wnl      Good results

## 2011-07-04 NOTE — Telephone Encounter (Signed)
Pt is aware.  

## 2011-07-21 ENCOUNTER — Other Ambulatory Visit: Payer: Self-pay | Admitting: Internal Medicine

## 2011-10-31 ENCOUNTER — Ambulatory Visit (INDEPENDENT_AMBULATORY_CARE_PROVIDER_SITE_OTHER): Payer: Medicare Other | Admitting: Internal Medicine

## 2011-10-31 ENCOUNTER — Encounter: Payer: Self-pay | Admitting: Internal Medicine

## 2011-10-31 DIAGNOSIS — Z23 Encounter for immunization: Secondary | ICD-10-CM

## 2011-10-31 DIAGNOSIS — Z87440 Personal history of urinary (tract) infections: Secondary | ICD-10-CM | POA: Insufficient documentation

## 2011-10-31 DIAGNOSIS — E039 Hypothyroidism, unspecified: Secondary | ICD-10-CM

## 2011-10-31 DIAGNOSIS — Z0000 Encounter for general adult medical examination without abnormal findings: Secondary | ICD-10-CM

## 2011-10-31 DIAGNOSIS — Z79899 Other long term (current) drug therapy: Secondary | ICD-10-CM

## 2011-10-31 LAB — CBC WITH DIFFERENTIAL/PLATELET
Basophils Absolute: 0 10*3/uL (ref 0.0–0.1)
Basophils Relative: 0.8 % (ref 0.0–3.0)
Eosinophils Absolute: 0.2 10*3/uL (ref 0.0–0.7)
Eosinophils Relative: 3.7 % (ref 0.0–5.0)
HCT: 42.9 % (ref 39.0–52.0)
Hemoglobin: 14.9 g/dL (ref 13.0–17.0)
Lymphocytes Relative: 28.6 % (ref 12.0–46.0)
Lymphs Abs: 1.8 10*3/uL (ref 0.7–4.0)
MCHC: 34.7 g/dL (ref 30.0–36.0)
MCV: 101.6 fl — ABNORMAL HIGH (ref 78.0–100.0)
Monocytes Absolute: 0.5 10*3/uL (ref 0.1–1.0)
Monocytes Relative: 7.5 % (ref 3.0–12.0)
Neutro Abs: 3.7 10*3/uL (ref 1.4–7.7)
Neutrophils Relative %: 59.4 % (ref 43.0–77.0)
Platelets: 222 10*3/uL (ref 150.0–400.0)
RBC: 4.22 Mil/uL (ref 4.22–5.81)
RDW: 13.1 % (ref 11.5–14.6)
WBC: 6.3 10*3/uL (ref 4.5–10.5)

## 2011-10-31 LAB — TSH: TSH: 2.55 u[IU]/mL (ref 0.35–5.50)

## 2011-10-31 NOTE — Assessment & Plan Note (Signed)
Sees  urology routinely, q 6 months

## 2011-10-31 NOTE — Progress Notes (Signed)
  Subjective:    Patient ID: Dean Dennis, male    DOB: [birth year], 55 y.o.   MRN: [id number]  HPI CPX  Past Medical History  Diagnosis Date  . Learning disability     evaluated in the past by psych "borderline functioning range of inttelligence  . Hypothyroidism   . Seizure 09/2005    saw neuro, they didnt think he had sz (-) EEG   Past Surgical History  Procedure Date  . Tonsillectomy    History   Social History  . Marital Status: Single    Spouse Name: N/A    Number of Children: 0  . Years of Education: N/A   Occupational History  . disability    Social History Main Topics  . Smoking status: Never Smoker   . Smokeless tobacco: Never Used  . Alcohol Use: No  . Drug Use: No  . Sexually Active: Not on file   Other Topics Concern  . Not on file   Social History Narrative   Lost his mother aprox 2 years ago, lives by himself, sisters check on him----Has 2 sister [name] ([phone]), [name] ([phone])----> they are the POA---ADL- cooks his own meals, does drive---Plays the banjo    Family History  Problem Relation Age of Onset  . Heart attack Father 40  . Prostate cancer Neg Hx   . Colon cancer Neg Hx   . Diabetes Neg Hx   . Lung cancer Mother     remote smoker     Review of Systems  Respiratory: Negative for cough and shortness of breath.   Cardiovascular: Negative for chest pain and leg swelling.  Gastrointestinal: Negative for abdominal pain and blood in stool.  Genitourinary: Negative for dysuria, hematuria and difficulty urinating.  Psychiatric/Behavioral:       No anxiety-depression       Objective:   Physical Exam  Constitutional: He is oriented to person, place, and time. He appears well-developed and well-nourished. No distress.  Neck: No thyromegaly present.       Normal carotid pulses  Cardiovascular: Normal rate, regular rhythm and normal heart sounds.   No murmur heard. Pulmonary/Chest: Effort normal and breath sounds normal. No  respiratory distress. He has no wheezes. He has no rales.  Abdominal: Soft. Bowel sounds are normal. He exhibits no distension. There is no tenderness. There is no rebound and no guarding.  Musculoskeletal: He exhibits no edema.  Neurological: He is alert and oriented to person, place, and time.  Skin: He is not diaphoretic.  Psychiatric: He has a normal mood and affect. His behavior is normal.       Assessment & Plan:

## 2011-10-31 NOTE — Assessment & Plan Note (Addendum)
Td 09 Flu shot today diet-exercise discussed  labs Colonoscopy Vs. IFOB: will discuss w/POA; addendum -------> discussed with [name], differences between the 2 options discussed, will leave a iFOB in the front for him to pick up and they will call me if interested in a colonoscopy Continue with ASA due to FH of CAD  PSA DRE-- at urology

## 2011-11-01 LAB — COMPLETE METABOLIC PANEL WITH GFR
ALT: 28 U/L (ref 0–53)
AST: 23 U/L (ref 0–37)
Albumin: 4.9 g/dL (ref 3.5–5.2)
Alkaline Phosphatase: 76 U/L (ref 39–117)
BUN: 17 mg/dL (ref 6–23)
CO2: 28 mEq/L (ref 19–32)
Calcium: 9.2 mg/dL (ref 8.4–10.5)
Chloride: 104 mEq/L (ref 96–112)
Creat: 1.18 mg/dL (ref 0.50–1.35)
GFR, Est African American: 82 mL/min — ABNORMAL LOW (ref >89)
GFR, Est Non African American: 71 mL/min — ABNORMAL LOW (ref >89)
Glucose, Bld: 97 mg/dL (ref 70–99)
Potassium: 4.1 mEq/L (ref 3.5–5.3)
Sodium: 143 mEq/L (ref 135–145)
Total Bilirubin: 0.9 mg/dL (ref 0.3–1.2)
Total Protein: 7.4 g/dL (ref 6.0–8.3)

## 2011-11-02 LAB — LIPID PANEL
Cholesterol: 214 mg/dL — ABNORMAL HIGH (ref 0–200)
HDL: 48.7 mg/dL (ref >39.00)
Total CHOL/HDL Ratio: 4
Triglycerides: 103 mg/dL (ref 0.0–149.0)
VLDL: 20.6 mg/dL (ref 0.0–40.0)

## 2011-11-02 LAB — LDL CHOLESTEROL, DIRECT: Direct LDL: 158.6 mg/dL

## 2011-11-06 ENCOUNTER — Telehealth: Payer: Self-pay

## 2011-11-06 NOTE — Telephone Encounter (Signed)
Message copied by Francisco Capuchin on Mon Nov 06, 2011 12:33 PM ------      Message from: Willow Ora E      Created: Sun Nov 05, 2011 12:16 PM       Advise patient:      Cholesterol has increased a little (to 214): watch diet!

## 2011-11-06 NOTE — Telephone Encounter (Signed)
Advise patient: Cholesterol has increased a little (to 214): watch diet! Copy of results mailed to address

## 2011-11-20 ENCOUNTER — Other Ambulatory Visit: Payer: Self-pay | Admitting: Internal Medicine

## 2012-03-21 ENCOUNTER — Other Ambulatory Visit: Payer: Self-pay | Admitting: Internal Medicine

## 2012-03-21 NOTE — Telephone Encounter (Signed)
Refill done.  

## 2012-04-29 ENCOUNTER — Encounter: Payer: Self-pay | Admitting: Internal Medicine

## 2012-04-29 ENCOUNTER — Ambulatory Visit (INDEPENDENT_AMBULATORY_CARE_PROVIDER_SITE_OTHER): Payer: Self-pay | Admitting: Internal Medicine

## 2012-04-29 VITALS — BP 140/86 | HR 53 | Temp 98.1°F | Wt 166.0 lb

## 2012-04-29 DIAGNOSIS — E039 Hypothyroidism, unspecified: Secondary | ICD-10-CM

## 2012-04-29 NOTE — Progress Notes (Signed)
  Subjective:    Patient ID: Dean Dennis, male    DOB: March 11, 1956, 56 y.o.   MRN: [id number]  HPI ROV Doing well, no concerns   Past Medical History  Diagnosis Date  . Learning disability     evaluated in the past by psych "borderline functioning range of inttelligence  . Hypothyroidism   . Seizure 09/2005    saw neuro, they didnt think he had sz (-) EEG   Social History: Single lost his mother aprox 2009 , lives by himself, sisters check on him has 2 sisters: Dean Dennis [phone]), Dean Dennis doesn't work ADL-- cooks his own meals, does drive plays the banjo  Review of Systems No CP-SOB No depression, plays the Banjo, goes to church Has not been taking ASA regulalrly     Objective:   Physical Exam General -- alert, well-developed, and not overweight appearing. No apparent distress.  lungs -- normal respiratory effort, no intercostal retractions, no accessory muscle use, and normal breath sounds.   Heart-- normal rate, regular rhythm, no murmur, and no gallop.   Extremities-- no pretibial edema bilaterally  Psych--  Alert and cooperative with normal attention span and concentration.  not anxious appearing and not depressed appearing.        Assessment & Plan:

## 2012-04-29 NOTE — Assessment & Plan Note (Signed)
Good med compliance, no change Recheck a TSH on RTC Rec ASA qd d/t FH of  CAD

## 2012-10-27 ENCOUNTER — Other Ambulatory Visit: Payer: Self-pay | Admitting: Internal Medicine

## 2012-10-28 NOTE — Telephone Encounter (Signed)
Refill done.  

## 2012-11-06 ENCOUNTER — Emergency Department (HOSPITAL_COMMUNITY)
Admission: EM | Admit: 2012-11-06 | Discharge: 2012-11-06 | Disposition: A | Discharge status: Home / Self Care | Payer: No Typology Code available for payment source | Attending: Emergency Medicine | Admitting: Emergency Medicine

## 2012-11-06 ENCOUNTER — Emergency Department (HOSPITAL_COMMUNITY): Payer: No Typology Code available for payment source

## 2012-11-06 ENCOUNTER — Encounter (HOSPITAL_COMMUNITY): Payer: Self-pay | Admitting: *Deleted

## 2012-11-06 DIAGNOSIS — Y9389 Activity, other specified: Secondary | ICD-10-CM | POA: Insufficient documentation

## 2012-11-06 DIAGNOSIS — Z87898 Personal history of other specified conditions: Secondary | ICD-10-CM | POA: Insufficient documentation

## 2012-11-06 DIAGNOSIS — Z8639 Personal history of other endocrine, nutritional and metabolic disease: Secondary | ICD-10-CM | POA: Insufficient documentation

## 2012-11-06 DIAGNOSIS — R404 Transient alteration of awareness: Secondary | ICD-10-CM | POA: Insufficient documentation

## 2012-11-06 DIAGNOSIS — Z862 Personal history of diseases of the blood and blood-forming organs and certain disorders involving the immune mechanism: Secondary | ICD-10-CM | POA: Insufficient documentation

## 2012-11-06 MED ORDER — HYDROCODONE-ACETAMINOPHEN 5-325 MG PO TABS: 1.0000 | ORAL_TABLET | Freq: Four times a day (QID) | ORAL | Status: DC | PRN

## 2012-11-06 MED ORDER — NAPROXEN 375 MG PO TABS: 375.0000 mg | ORAL_TABLET | Freq: Two times a day (BID) | ORAL | Status: DC
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
## 2012-11-06 NOTE — ED Provider Notes (Signed)
Medical screening examination/treatment/procedure(s) were performed by non-physician practitioner and as supervising physician I was immediately available for consultation/collaboration.   Richardean Canal, MD 11/06/12 [phone]
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
## 2012-11-25 ENCOUNTER — Other Ambulatory Visit: Payer: Self-pay | Admitting: Internal Medicine

## 2012-11-25 NOTE — Telephone Encounter (Signed)
Lats TSH was 10/31/11. Please advise    KP

## 2012-11-26 NOTE — Telephone Encounter (Signed)
ok #30, one refill. Arrange  CPX within 6 weeks, no further RF without office visit

## 2012-11-26 NOTE — Telephone Encounter (Signed)
Refill done. Pt schedule appt. 12.17.13.

## 2012-12-10 ENCOUNTER — Encounter: Payer: Self-pay | Admitting: Internal Medicine

## 2012-12-10 ENCOUNTER — Ambulatory Visit (INDEPENDENT_AMBULATORY_CARE_PROVIDER_SITE_OTHER): Payer: Medicare Other | Admitting: Internal Medicine

## 2012-12-10 ENCOUNTER — Encounter: Payer: Self-pay | Admitting: *Deleted

## 2012-12-10 VITALS — BP 138/82 | HR 56 | Temp 98.4°F | Wt 163.0 lb

## 2012-12-10 DIAGNOSIS — E039 Hypothyroidism, unspecified: Secondary | ICD-10-CM

## 2012-12-10 DIAGNOSIS — Z23 Encounter for immunization: Secondary | ICD-10-CM

## 2012-12-10 LAB — TSH: TSH: 1.4 u[IU]/mL (ref 0.35–5.50)

## 2012-12-10 MED ORDER — LEVOTHYROXINE SODIUM 100 MCG PO TABS: 100.0000 ug | ORAL_TABLET | Freq: Every day | ORAL | Status: DC

## 2012-12-10 NOTE — Assessment & Plan Note (Signed)
Good compliance with medication, needs a refill, also due for a TSH.
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
## 2012-12-10 NOTE — Patient Instructions (Addendum)
Please come back in 4-5 months for a physical exam

## 2013-02-13 ENCOUNTER — Telehealth: Payer: Self-pay | Admitting: Internal Medicine

## 2013-02-13 DIAGNOSIS — F8189 Other developmental disorders of scholastic skills: Secondary | ICD-10-CM

## 2013-02-13 NOTE — Telephone Encounter (Signed)
Spoke to pt's sister Dean Dennis & she states that she is very concerned about pt Dean Dennis & the decisions he has been making lately. She states that in the past she has tried to have him declared incompetent but they were unsuccessful & they would like to try again. She would like to know what is the best way to go about doing this. The pt has an appt scheduled 3.14.14. Please advise.

## 2013-02-13 NOTE — Telephone Encounter (Signed)
(  Sister ids the POA). I'll see him as schedule, in the meantime , refer to Dr Jennelle Human (psych) Dx  competency eval

## 2013-02-13 NOTE — Telephone Encounter (Signed)
Patient's daughters are concerned about what they can do for their father. They would like to know what kinda of things should they look for and how they can get help. Dean Dennis [phone]

## 2013-02-14 NOTE — Telephone Encounter (Signed)
Discussed with pt's sister.  Entered referral.

## 2013-03-07 ENCOUNTER — Encounter: Payer: Self-pay | Admitting: Internal Medicine

## 2013-03-07 ENCOUNTER — Ambulatory Visit (INDEPENDENT_AMBULATORY_CARE_PROVIDER_SITE_OTHER): Payer: Medicare Other | Admitting: Internal Medicine

## 2013-03-07 VITALS — BP 150/90 | HR 65 | Wt 168.0 lb

## 2013-03-07 DIAGNOSIS — F8189 Other developmental disorders of scholastic skills: Secondary | ICD-10-CM

## 2013-03-07 DIAGNOSIS — I10 Essential (primary) hypertension: Secondary | ICD-10-CM

## 2013-03-07 LAB — CBC WITH DIFFERENTIAL/PLATELET
Basophils Absolute: 0.1 10*3/uL (ref 0.0–0.1)
Basophils Relative: 1.2 % (ref 0.0–3.0)
Eosinophils Absolute: 0.2 10*3/uL (ref 0.0–0.7)
Eosinophils Relative: 2.6 % (ref 0.0–5.0)
HCT: 43.3 % (ref 39.0–52.0)
Hemoglobin: 14.7 g/dL (ref 13.0–17.0)
Lymphocytes Relative: 28.4 % (ref 12.0–46.0)
Lymphs Abs: 2 10*3/uL (ref 0.7–4.0)
MCHC: 34 g/dL (ref 30.0–36.0)
MCV: 100 fl (ref 78.0–100.0)
Monocytes Absolute: 0.7 10*3/uL (ref 0.1–1.0)
Monocytes Relative: 10 % (ref 3.0–12.0)
Neutro Abs: 4.1 10*3/uL (ref 1.4–7.7)
Neutrophils Relative %: 57.8 % (ref 43.0–77.0)
Platelets: 258 10*3/uL (ref 150.0–400.0)
RBC: 4.33 Mil/uL (ref 4.22–5.81)
RDW: 13.5 % (ref 11.5–14.6)
WBC: 7.1 10*3/uL (ref 4.5–10.5)

## 2013-03-07 LAB — BASIC METABOLIC PANEL
BUN: 21 mg/dL (ref 6–23)
CO2: 28 mEq/L (ref 19–32)
Calcium: 9.4 mg/dL (ref 8.4–10.5)
Chloride: 103 mEq/L (ref 96–112)
Creatinine, Ser: 1.2 mg/dL (ref 0.4–1.5)
GFR: 66.33 mL/min (ref >60.00)
Glucose, Bld: 89 mg/dL (ref 70–99)
Potassium: 3.7 mEq/L (ref 3.5–5.1)
Sodium: 139 mEq/L (ref 135–145)

## 2013-03-07 MED ORDER — AMLODIPINE BESYLATE 5 MG PO TABS: 5.0000 mg | ORAL_TABLET | Freq: Every day | ORAL | Status: DC

## 2013-03-07 NOTE — Patient Instructions (Addendum)
Check the  blood pressure 2 or 3 times a week, be sure it is between 110/60 and 140/85. If it is consistently higher or lower, let me know  

## 2013-03-07 NOTE — Assessment & Plan Note (Signed)
We spent a majority of these visit counseling about his learning disability now with behavioral issues. He has show some risky behavior in Internet, getting related with strangers, has been "engaged" with 2 ladies that he never met personally. He has an appointment w/ psychiatry, Dr. Jennelle Human 03/31/2013 and I strongly encouraged them to keep it. I counseled him the best I could. He may need placement at some point, encouraged him to contact the adult protective services, contact numbers provided. Also strongly recommend to remove acces to any guns that he may have.
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
## 2013-03-07 NOTE — Assessment & Plan Note (Signed)
Mile hypertension, labs and start amlodipine

## 2013-04-07 ENCOUNTER — Encounter: Payer: Self-pay | Admitting: Family

## 2013-04-07 ENCOUNTER — Ambulatory Visit (INDEPENDENT_AMBULATORY_CARE_PROVIDER_SITE_OTHER): Payer: Medicare Other | Admitting: Family

## 2013-04-07 VITALS — BP 140/100 | HR 71 | Temp 97.8°F | Resp 16 | Wt 173.0 lb

## 2013-04-07 DIAGNOSIS — D239 Other benign neoplasm of skin, unspecified: Secondary | ICD-10-CM

## 2013-04-07 DIAGNOSIS — I10 Essential (primary) hypertension: Secondary | ICD-10-CM

## 2013-04-07 DIAGNOSIS — M7989 Other specified soft tissue disorders: Secondary | ICD-10-CM | POA: Insufficient documentation

## 2013-04-07 DIAGNOSIS — R609 Edema, unspecified: Secondary | ICD-10-CM

## 2013-04-07 MED ORDER — LISINOPRIL-HYDROCHLOROTHIAZIDE 10-12.5 MG PO TABS: 1.0000 | ORAL_TABLET | Freq: Every day | ORAL | Status: DC

## 2013-04-07 NOTE — Patient Instructions (Addendum)
Stop norvasc, start lisinopril hctz. Call if you developed increased swelling, or if you develop shortness of breath.  Follow up in 2 weeks with Dr. Drue Novel for bp check and blood work. You will be contacted about your referral to dermatology.  Please let us know if you have not heard back within 1 week about your referral.

## 2013-04-07 NOTE — Assessment & Plan Note (Signed)
Will refer to dermatology for further evaluation.  

## 2013-04-07 NOTE — Assessment & Plan Note (Addendum)
BP Readings from Last 3 Encounters:  04/07/13 140/100  03/07/13 150/90  12/10/12 138/82   Improved but still above goal.  Will stop amlodipine due to swelling and switch to lisinopril hctz Plan follow up in 2 weeks for bp check and bmet.
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
## 2013-04-07 NOTE — Assessment & Plan Note (Addendum)
EKG is performed today and shows NSR without acute changes. Suspect swelling is related to amlodipine side effect.  Change bp meds to see if this helps.

## 2013-04-21 ENCOUNTER — Ambulatory Visit (INDEPENDENT_AMBULATORY_CARE_PROVIDER_SITE_OTHER): Payer: Medicare Other | Admitting: Internal Medicine

## 2013-04-21 ENCOUNTER — Encounter: Payer: Self-pay | Admitting: Internal Medicine

## 2013-04-21 VITALS — BP 114/76 | HR 60 | Temp 98.0°F | Wt 169.0 lb

## 2013-04-21 DIAGNOSIS — I10 Essential (primary) hypertension: Secondary | ICD-10-CM

## 2013-04-21 DIAGNOSIS — R238 Other skin changes: Secondary | ICD-10-CM

## 2013-04-21 DIAGNOSIS — F8189 Other developmental disorders of scholastic skills: Secondary | ICD-10-CM

## 2013-04-21 DIAGNOSIS — R209 Unspecified disturbances of skin sensation: Secondary | ICD-10-CM | POA: Insufficient documentation

## 2013-04-21 MED ORDER — LISINOPRIL-HYDROCHLOROTHIAZIDE 10-12.5 MG PO TABS: 1.0000 | ORAL_TABLET | Freq: Every day | ORAL | Status: DC

## 2013-04-21 MED ORDER — LEVOTHYROXINE SODIUM 100 MCG PO TABS: 100.0000 ug | ORAL_TABLET | Freq: Every day | ORAL | Status: DC

## 2013-04-21 NOTE — Assessment & Plan Note (Signed)
Saw psych recently, at some point may need a group home

## 2013-04-21 NOTE — Assessment & Plan Note (Signed)
Amlodipine was discontinued due to swelling, at the time of eval 2 weeks ago BP was a slightly elevated. He is responded very well to lisinopril HCT, no apparent side effects. Plan: Refill medications, BMP, continue monitoring BPs at home (instructions discussed with the patient on his sister)

## 2013-04-21 NOTE — Assessment & Plan Note (Signed)
Cold extremities, Not clear  Raynaud phenomena on clinical grounds, good circulation on exam. Recommend avoidance of cold temperatures for now, I don't disagree with aspirin daily.

## 2013-04-21 NOTE — Progress Notes (Signed)
  Subjective:    Patient ID: Dean Dennis, male    DOB: Jun 10, 1956, 57 y.o.   MRN: [id number]  HPI Followup hypertension, here with one of his sisters . Was a started on amlodipine, Developed lower extremity edema, went to see Ms.O' Lendell Caprice recently, BP was a slightly elevated . Amlodipine was discontinued and was started onl isinopril-HCT. BP today is very good.  Past Medical History  Diagnosis Date  . Learning disability     evaluated in the past by psych "borderline functioning range of inttelligence  . Hypothyroidism   . Seizure 09/2005    saw neuro, they didnt think he had sz (-) EEG  . HTN (hypertension)    Past Surgical History  Procedure Laterality Date  . Tonsillectomy      Review of Systems Edema is better. Today he reports that hands and feet are tingly  from time to time, usually related to cold weather or cold temperature. Does not recall any change in the color of his hands. Denies claudication per se. His sister  Started him on ASA daily      Objective:   Physical Exam  General -- alert, well-developed, No apparent distress.    Lungs -- normal respiratory effort, no intercostal retractions, no accessory muscle use, and normal breath sounds. CV --RRR Extremities--Hands and feet are indeed slightly cold to touch however there is good radial and pedal pulses, brisk capillary refill at all fingers and toes, no cyanoses although the  plantar aspect of his feet are slightly discolored.  Heart-- normal rate, regular rhythm, no murmur, and no gallop.    Psych--  not anxious appearing and not depressed appearing.       Assessment & Plan:

## 2013-05-03 ENCOUNTER — Telehealth: Payer: Self-pay | Admitting: Internal Medicine

## 2013-05-03 NOTE — Telephone Encounter (Signed)
Ordered a BMP at last visit, apparently not done, please arrange Dx HTN

## 2013-05-08 NOTE — Telephone Encounter (Signed)
Spoke w/pt. He states he will let his sister know and have her call me back.

## 2013-05-08 NOTE — Telephone Encounter (Signed)
Thanks

## 2013-05-14 ENCOUNTER — Other Ambulatory Visit: Payer: Medicare Other

## 2013-05-15 ENCOUNTER — Encounter: Payer: Self-pay | Admitting: *Deleted

## 2013-05-15 LAB — BASIC METABOLIC PANEL
BUN: 18 mg/dL (ref 6–23)
CO2: 29 mEq/L (ref 19–32)
Calcium: 9 mg/dL (ref 8.4–10.5)
Chloride: 102 mEq/L (ref 96–112)
Creatinine, Ser: 1.3 mg/dL (ref 0.4–1.5)
GFR: 62.69 mL/min (ref >60.00)
Glucose, Bld: 92 mg/dL (ref 70–99)
Potassium: 3.6 mEq/L (ref 3.5–5.1)
Sodium: 139 mEq/L (ref 135–145)

## 2013-08-21 ENCOUNTER — Encounter: Payer: Medicare Other | Admitting: Internal Medicine

## 2013-09-02 ENCOUNTER — Ambulatory Visit (INDEPENDENT_AMBULATORY_CARE_PROVIDER_SITE_OTHER): Payer: Medicare Other | Admitting: Internal Medicine

## 2013-09-02 ENCOUNTER — Encounter: Payer: Self-pay | Admitting: Internal Medicine

## 2013-09-02 VITALS — BP 120/77 | HR 56 | Temp 98.5°F | Ht 67.5 in | Wt 168.0 lb

## 2013-09-02 DIAGNOSIS — D239 Other benign neoplasm of skin, unspecified: Secondary | ICD-10-CM

## 2013-09-02 DIAGNOSIS — E039 Hypothyroidism, unspecified: Secondary | ICD-10-CM

## 2013-09-02 DIAGNOSIS — Z23 Encounter for immunization: Secondary | ICD-10-CM

## 2013-09-02 DIAGNOSIS — Z87440 Personal history of urinary (tract) infections: Secondary | ICD-10-CM

## 2013-09-02 DIAGNOSIS — Z0000 Encounter for general adult medical examination without abnormal findings: Secondary | ICD-10-CM

## 2013-09-02 DIAGNOSIS — N390 Urinary tract infection, site not specified: Secondary | ICD-10-CM

## 2013-09-02 HISTORY — DX: Urinary tract infection, site not specified: N39.0

## 2013-09-02 LAB — AST: AST: 25 U/L (ref 0–37)

## 2013-09-02 LAB — URINALYSIS, ROUTINE W REFLEX MICROSCOPIC
Bilirubin Urine: NEGATIVE
Ketones, ur: NEGATIVE
Leukocytes, UA: NEGATIVE
Nitrite: NEGATIVE
Specific Gravity, Urine: 1.015 (ref 1.000–1.030)
Total Protein, Urine: NEGATIVE
Urine Glucose: NEGATIVE
Urobilinogen, UA: 0.2 (ref 0.0–1.0)
pH: 7 (ref 5.0–8.0)

## 2013-09-02 LAB — LIPID PANEL
Cholesterol: 232 mg/dL — ABNORMAL HIGH (ref 0–200)
HDL: 48.9 mg/dL (ref >39.00)
Total CHOL/HDL Ratio: 5
Triglycerides: 97 mg/dL (ref 0.0–149.0)
VLDL: 19.4 mg/dL (ref 0.0–40.0)

## 2013-09-02 LAB — LDL CHOLESTEROL, DIRECT: Direct LDL: 172.1 mg/dL

## 2013-09-02 LAB — PSA: PSA: 1.3 ng/mL (ref 0.10–4.00)

## 2013-09-02 LAB — ALT: ALT: 24 U/L (ref 0–53)

## 2013-09-02 LAB — TSH: TSH: 1.9 u[IU]/mL (ref 0.35–5.50)

## 2013-09-02 NOTE — Patient Instructions (Signed)
Get your blood work before you leave  Next visit in 6 months for a routine office visit Please make an appointment before you leave the office today (or call few weeks in advance)

## 2013-09-02 NOTE — Assessment & Plan Note (Signed)
Used to see urology, last visit ~ 2012, asx, check a UA 

## 2013-09-02 NOTE — Assessment & Plan Note (Addendum)
Td 09  zostavax discussed -- provided today diet-exercise discussed  labs Colonoscopy Vs. IFOB discuss  --- refer to GI, contact sister [phone] [place]) Continue with ASA due to FH of CAD

## 2013-09-02 NOTE — Assessment & Plan Note (Addendum)
Pt's sister reports he was seen by derm,  got good reports

## 2013-09-02 NOTE — Assessment & Plan Note (Deleted)
Used to see urology, last visit ~ 2012, asx, check a UA

## 2013-09-02 NOTE — Progress Notes (Signed)
  Subjective:    Patient ID: Dean Dennis, male    DOB: 07-Jul-1956, 57 y.o.   MRN: [id number]  HPI CPX here w/ his sister   Past Medical History  Diagnosis Date  . Learning disability     evaluated in the past by psych "borderline functioning range of inttelligence  . Hypothyroidism   . Seizure 09/2005    saw neuro, they didnt think he had sz (-) EEG  . HTN (hypertension)   . Recurrent UTI 09/02/2013   Past Surgical History  Procedure Laterality Date  . Tonsillectomy    . Vasectomy  2004   History   Social History  . Marital Status: Single    Spouse Name: N/A    Number of Children: 0  . Years of Education: N/A   Occupational History  . disability    Social History Main Topics  . Smoking status: Never Smoker   . Smokeless tobacco: Never Used  . Alcohol Use: No  . Drug Use: No  . Sexual Activity: Not on file   Other Topics Concern  . Not on file   Social History Narrative   Lost his mother , lives by himself, sisters check on him    Has 2 sister [name] [phone]), [name] ([phone])----> they are the POA    ADL- cooks his own meals, independent   Plays the banjo    Family History  Problem Relation Age of Onset  . Heart attack Father 72  . Prostate cancer Neg Hx   . Colon cancer Neg Hx   . Diabetes Neg Hx   . Lung cancer Mother     remote smoker     Review of Systems Diet--trying to eat healthy Exercise--nothing regular but plans to get more active. Good medication compliance, not ambulatory BPs. Denies chest pain or shortness or breath No  nausea, vomiting, diarrhea blood in the stools. No dysuria or gross hematuria.       Objective:   Physical Exam BP 120/77  Pulse 56  Temp(Src) 98.5 F (36.9 C)  Ht 5' 7.5" (1.715 m)  Wt 168 lb (76.204 kg)  BMI 25.91 kg/m2  SpO2 98% General -- alert, well-developed, NAD.  Neck --no thyromegaly , normal carotid pulse Lungs -- normal respiratory effort, no intercostal retractions, no accessory muscle use,  and normal breath sounds.  Heart-- normal rate, regular rhythm, no murmur.  Abdomen-- Not distended, good bowel sounds,soft, non-tender. No rebound or rigidity. Rectal-- No external abnormalities noted. Normal sphincter tone. No rectal masses or tenderness. Brown stool, Hemoccult negative  Prostate: Prostate gland firm and smooth, no enlargement, nodularity, tenderness, mass, asymmetry or induration. Extremities-- no pretibial edema bilaterally  Neurologic-- alert & oriented X3. Speech, gait normal. Psych--  not anxious or depressed appearing.       Assessment & Plan:

## 2013-09-05 ENCOUNTER — Telehealth: Payer: Self-pay | Admitting: *Deleted

## 2013-09-05 NOTE — Telephone Encounter (Signed)
Message copied by Eustace Quail on Fri Sep 05, 2013  1:29 PM ------      Message from: Willow Ora E      Created: Thu Sep 04, 2013  4:11 PM       Please call the patient's sisterJanine [phone])      His cholesterol has increased compared to last year, in addition to stay active and eat healthy he could start medication or simply recheck next year. Let me know if they're interested in medicine.      All the other tests are normal, his thyroid is well-balanced.      Continue with same medications and come back in 6 months as recommended. ------

## 2013-09-05 NOTE — Telephone Encounter (Signed)
pts sister was notified via telephone of pts lab results and for the pt to stay active and eat healthy. pts sister would like for him to stay active and eat an healthy diet before medicine is needed. Will schedule a appointment in 80m/o. DJR

## 2013-11-07 ENCOUNTER — Other Ambulatory Visit: Payer: Self-pay | Admitting: Internal Medicine

## 2013-11-07 NOTE — Telephone Encounter (Signed)
Lisinopril and levothyroxine refilled

## 2013-11-12 ENCOUNTER — Ambulatory Visit (AMBULATORY_SURGERY_CENTER): Payer: Self-pay

## 2013-11-12 VITALS — Ht 69.0 in | Wt 160.0 lb

## 2013-11-12 DIAGNOSIS — Z1211 Encounter for screening for malignant neoplasm of colon: Secondary | ICD-10-CM

## 2013-11-12 MED ORDER — MOVIPREP 100 G PO SOLR: 1.0000 | Freq: Once | ORAL | Status: DC

## 2013-11-13 ENCOUNTER — Encounter: Payer: Self-pay | Admitting: Internal Medicine

## 2013-11-26 ENCOUNTER — Ambulatory Visit (AMBULATORY_SURGERY_CENTER): Payer: Medicare Other | Admitting: Internal Medicine

## 2013-11-26 ENCOUNTER — Encounter: Payer: Self-pay | Admitting: Internal Medicine

## 2013-11-26 VITALS — BP 126/77 | HR 58 | Temp 97.2°F | Resp 19 | Ht 69.0 in | Wt 160.0 lb

## 2013-11-26 DIAGNOSIS — Z1211 Encounter for screening for malignant neoplasm of colon: Secondary | ICD-10-CM

## 2013-11-26 DIAGNOSIS — D126 Benign neoplasm of colon, unspecified: Secondary | ICD-10-CM

## 2013-11-26 MED ORDER — SODIUM CHLORIDE 0.9 % IV SOLN: 500.0000 mL | INTRAVENOUS | Status: DC

## 2013-11-26 NOTE — Patient Instructions (Signed)

## 2013-11-26 NOTE — Progress Notes (Signed)
Patient did not experience any of the following events: a burn prior to discharge; a fall within the facility; wrong site/side/patient/procedure/implant event; or a hospital transfer or hospital admission upon discharge from the facility. (G8907) Patient did not have preoperative order for IV antibiotic SSI prophylaxis. (G8918)  

## 2013-11-26 NOTE — Progress Notes (Signed)
Report to pacu rn, vss, bbs=clear 

## 2013-11-26 NOTE — Progress Notes (Signed)
Called to room to assist during endoscopic procedure.  Patient ID and intended procedure confirmed with present staff. Received instructions for my participation in the procedure from the performing physician. ewm 

## 2013-11-26 NOTE — Op Note (Signed)
[facility] [address] [place], [address]   COLONOSCOPY PROCEDURE REPORT  PATIENT: Dean Dennis, Dean Dennis  MR#: [id number] BIRTHDATE: [birth year] , 57  yrs. old GENDER: Male ENDOSCOPIST: Beverley Fiedler, MD REFERRED VH:QION Drue Novel, M.D. PROCEDURE DATE:  11/26/2013 PROCEDURE:   Colonoscopy with snare polypectomy and Colonoscopy with cold biopsy polypectomy First Screening Colonoscopy - Avg.  risk and is 50 yrs.  old or older Yes.  Prior Negative Screening - Now for repeat screening. N/A  History of Adenoma - Now for follow-up colonoscopy & has been > or = to 3 yrs.  N/A  Polyps Removed Today? Yes. ASA CLASS:   Class II INDICATIONS:average risk screening and first colonoscopy. MEDICATIONS: MAC sedation, administered by CRNA and propofol (Diprivan) 400mg  IV  DESCRIPTION OF PROCEDURE:   After the risks benefits and alternatives of the procedure were thoroughly explained, informed consent was obtained.  A digital rectal exam revealed no rectal mass.   The LB [id number]  endoscope was introduced through the anus and advanced to the cecum, which was identified by both the appendix and ileocecal valve. No adverse events experienced. The quality of the prep was good, using MoviPrep  The instrument was then slowly withdrawn as the colon was fully examined.   COLON FINDINGS: Two sessile polyps ranging between 3-25mm in size were found in the transverse colon and rectum.  Polypectomy was performed with cold forceps and using cold snare.  All resections were complete and all polyp tissue was completely retrieved.   The colon mucosa was otherwise normal.  Retroflexed views revealed small internal hemorrhoids. The time to cecum=9 minutes 57 seconds. Withdrawal time=12 minutes 56 seconds.  The scope was withdrawn and the procedure completed.  COMPLICATIONS: There were no complications.  ENDOSCOPIC IMPRESSION: 1.   Two sessile polyps ranging between 3-1mm in size were found  in the transverse colon and rectum; Polypectomy was performed with cold forceps and using cold snare 2.   The colon mucosa was otherwise normal  RECOMMENDATIONS: 1.  Await pathology results 2.  If the polyps removed today are proven to be adenomatous (pre-cancerous) polyps, you will need a repeat colonoscopy in 5 years.  Otherwise you should continue to follow colorectal cancer screening guidelines for "routine risk" patients with colonoscopy in 10 years.  You will receive a letter within 1-2 weeks with the results of your biopsy as well as final recommendations.  Please call my office if you have not received a letter after 3 weeks.   eSigned:  Beverley Fiedler, MD 11/26/2013 8:35 AM   cc: The Patient and Willow Ora, MD

## 2013-11-27 ENCOUNTER — Telehealth: Payer: Self-pay | Admitting: *Deleted

## 2013-11-27 NOTE — Telephone Encounter (Signed)
  Follow up Call-  Call back number 11/26/2013  Post procedure Call Back phone  # [phone]  Permission to leave phone message Yes     Patient questions:  Do you have a fever, pain , or abdominal swelling? no Pain Score  0 *  Have you tolerated food without any problems? yes  Have you been able to return to your normal activities? no  Do you have any questions about your discharge instructions: Diet   no Medications  no Follow up visit  no  Do you have questions or concerns about your Care? no  Actions: * If pain score is 4 or above: No action needed, pain <4.

## 2013-12-02 ENCOUNTER — Encounter: Payer: Self-pay | Admitting: Internal Medicine

## 2014-05-19 ENCOUNTER — Other Ambulatory Visit: Payer: Self-pay | Admitting: Internal Medicine

## 2014-06-22 ENCOUNTER — Other Ambulatory Visit: Payer: Self-pay | Admitting: Internal Medicine

## 2014-06-23 ENCOUNTER — Other Ambulatory Visit: Payer: Self-pay | Admitting: Internal Medicine

## 2014-09-28 ENCOUNTER — Other Ambulatory Visit: Payer: Self-pay | Admitting: Internal Medicine

## 2014-11-02 ENCOUNTER — Other Ambulatory Visit: Payer: Self-pay | Admitting: Internal Medicine

## 2014-12-09 ENCOUNTER — Other Ambulatory Visit: Payer: Self-pay | Admitting: Internal Medicine

## 2014-12-16 ENCOUNTER — Other Ambulatory Visit: Payer: Self-pay | Admitting: Internal Medicine

## 2014-12-16 ENCOUNTER — Telehealth: Payer: Self-pay | Admitting: Internal Medicine

## 2014-12-16 ENCOUNTER — Other Ambulatory Visit: Payer: Self-pay

## 2014-12-16 MED ORDER — LEVOTHYROXINE SODIUM 100 MCG PO TABS: ORAL_TABLET | ORAL | Status: DC

## 2014-12-16 NOTE — Telephone Encounter (Signed)
Caller name: Burton Relation to pt: self Call back number: [phone] Pharmacy:  Reason for call:   Wants To discuss thyroid medication

## 2014-12-16 NOTE — Telephone Encounter (Signed)
I did not approve her medication because she has not been seen since 09/02/2013, she has a CPE for April, she needs to have F/U appt before then to have her TSH checked, since that has not been checked since then either.

## 2014-12-16 NOTE — Telephone Encounter (Signed)
Pt has not been seen since 08/2013, she will need an appt for any further refills, or to discuss medication.

## 2014-12-16 NOTE — Telephone Encounter (Signed)
Spoke with Dr. Larose Kells he approved another 1 month supply only until he is seen, sent to Pharmacy. Needs appt before medication runs out.

## 2014-12-16 NOTE — Telephone Encounter (Signed)
Informed patient sister of this and scheduled appointment

## 2014-12-16 NOTE — Telephone Encounter (Signed)
Caller name: Blackwelder,[name] Relation to pt: sister  Call back [place]: [facility] [phone]   Reason for call:  Pt sister stated pharamacy did not approve   levothyroxine (SYNTHROID) 100 MCG tablet

## 2014-12-21 ENCOUNTER — Ambulatory Visit (HOSPITAL_BASED_OUTPATIENT_CLINIC_OR_DEPARTMENT_OTHER)
Admission: RE | Admit: 2014-12-21 | Discharge: 2014-12-21 | Disposition: A | Discharge status: Home / Self Care | Payer: Medicare Other | Source: Ambulatory Visit | Attending: Internal Medicine | Admitting: Internal Medicine

## 2014-12-21 ENCOUNTER — Encounter: Payer: Self-pay | Admitting: Internal Medicine

## 2014-12-21 ENCOUNTER — Ambulatory Visit (INDEPENDENT_AMBULATORY_CARE_PROVIDER_SITE_OTHER): Payer: Medicare Other | Admitting: Internal Medicine

## 2014-12-21 ENCOUNTER — Ambulatory Visit (INDEPENDENT_AMBULATORY_CARE_PROVIDER_SITE_OTHER): Payer: Medicare Other

## 2014-12-21 VITALS — BP 121/74 | HR 52 | Temp 97.9°F | Ht 67.0 in | Wt 165.1 lb

## 2014-12-21 DIAGNOSIS — R0781 Pleurodynia: Secondary | ICD-10-CM | POA: Diagnosis not present

## 2014-12-21 DIAGNOSIS — E785 Hyperlipidemia, unspecified: Secondary | ICD-10-CM

## 2014-12-21 DIAGNOSIS — R05 Cough: Secondary | ICD-10-CM | POA: Diagnosis not present

## 2014-12-21 DIAGNOSIS — R079 Chest pain, unspecified: Secondary | ICD-10-CM | POA: Insufficient documentation

## 2014-12-21 DIAGNOSIS — Z23 Encounter for immunization: Secondary | ICD-10-CM

## 2014-12-21 DIAGNOSIS — I10 Essential (primary) hypertension: Secondary | ICD-10-CM

## 2014-12-21 DIAGNOSIS — E034 Atrophy of thyroid (acquired): Secondary | ICD-10-CM

## 2014-12-21 DIAGNOSIS — E038 Other specified hypothyroidism: Secondary | ICD-10-CM

## 2014-12-21 LAB — LIPID PANEL
Cholesterol: 247 mg/dL — ABNORMAL HIGH (ref 0–200)
HDL: 53.8 mg/dL (ref >39.00)
LDL Cholesterol: 168 mg/dL — ABNORMAL HIGH (ref 0–99)
NonHDL: 193.2
Total CHOL/HDL Ratio: 5
Triglycerides: 127 mg/dL (ref 0.0–149.0)
VLDL: 25.4 mg/dL (ref 0.0–40.0)

## 2014-12-21 LAB — BASIC METABOLIC PANEL
BUN: 21 mg/dL (ref 6–23)
CO2: 31 mEq/L (ref 19–32)
Calcium: 9.1 mg/dL (ref 8.4–10.5)
Chloride: 100 mEq/L (ref 96–112)
Creatinine, Ser: 1.2 mg/dL (ref 0.4–1.5)
GFR: 66.53 mL/min (ref >60.00)
Glucose, Bld: 85 mg/dL (ref 70–99)
Potassium: 3.9 mEq/L (ref 3.5–5.1)
Sodium: 138 mEq/L (ref 135–145)

## 2014-12-21 LAB — TSH: TSH: 20.41 u[IU]/mL — ABNORMAL HIGH (ref 0.35–4.50)

## 2014-12-21 MED ORDER — LISINOPRIL-HYDROCHLOROTHIAZIDE 10-12.5 MG PO TABS: 1.0000 | ORAL_TABLET | Freq: Every day | ORAL | Status: DC

## 2014-12-21 MED ORDER — LEVOTHYROXINE SODIUM 100 MCG PO TABS: ORAL_TABLET | ORAL | Status: DC

## 2014-12-21 NOTE — Patient Instructions (Signed)
Get your blood work before you leave   Stop by the first floor and get the XR    Please come back to the office in 4 months  for a physical exam. Come back fasting

## 2014-12-21 NOTE — Assessment & Plan Note (Signed)
Continue lisinopril HCTZ, check a BMP,

## 2014-12-21 NOTE — Progress Notes (Signed)
   Subjective:    Patient ID: Dean Dennis, male    DOB: October 25, 1956, 58 y.o.   MRN: [id number]  DOS:  12/21/2014 Type of visit - description : rov Interval history: Hypertension, good compliance of medications, not ambulatory BPs. BP today is very good Hypothyroidism, on Synthroid, needs a refill. Hyperlipidemia, last cholesterol was elevated, we discussed that with a POA and she liked to try diet and exercise before medication. Due for labs.  ROS States he is doing very well, when asked, admits to pain at the left mid back with coughing or sneezing. This is going on for a while, could not tell me more details. Denies any abdominal pain or pain after eating. No fever, chills or weight loss that he can tell. No dysuria, gross hematuria difficulty urinating  Past Medical History  Diagnosis Date  . Learning disability     evaluated in the past by psych "borderline functioning range of inttelligence  . Hypothyroidism   . Seizure 09/2005    saw neuro, they didnt think he had sz (-) EEG  . HTN (hypertension)   . Recurrent UTI 09/02/2013    Past Surgical History  Procedure Laterality Date  . Tonsillectomy    . Vasectomy  2004    History   Social History  . Marital Status: Single    Spouse Name: N/A    Number of Children: 0  . Years of Education: N/A   Occupational History  . disability    Social History Main Topics  . Smoking status: Never Smoker   . Smokeless tobacco: Never Used  . Alcohol Use: No  . Drug Use: No  . Sexual Activity: Not on file   Other Topics Concern  . Not on file   Social History Narrative   Lost his mother , lives by himself, sisters check on him    Has 2 sister [name] [phone]), [name] [phone])----> they are the POA    ADL- cooks his own meals, independent   Plays the banjo         Medication List       This list is accurate as of: 12/21/14  7:03 PM.  Always use your most recent med list.               aspirin 81 MG tablet    Take 81 mg by mouth daily.     levothyroxine 100 MCG tablet  Commonly known as:  SYNTHROID  Take 1 tablet daily.     lisinopril-hydrochlorothiazide 10-12.5 MG per tablet  Commonly known as:  PRINZIDE,ZESTORETIC  Take 1 tablet by mouth daily.           Objective:   Physical Exam  Musculoskeletal:       Arms:  BP 121/74 mmHg  Pulse 52  Temp(Src) 97.9 F (36.6 C) (Oral)  Ht 5\' 7"  (1.702 m)  Wt 165 lb 2 oz (74.9 kg)  BMI 25.86 kg/m2  SpO2 95% General -- alert, well-developed, NAD.  Lungs -- normal respiratory effort, no intercostal retractions, no accessory muscle use, and normal breath sounds.  Heart-- normal rate, regular rhythm, no murmur.  Abdomen-- Not distended, good bowel sounds,soft, non-tender. Extremities-- no pretibial edema bilaterally  Neurologic--  alert & oriented X3.   Psych--  No anxious or depressed appearing.     Assessment & Plan:

## 2014-12-21 NOTE — Assessment & Plan Note (Signed)
Chest pain as described in the history of present illness, will get a chest x-ray.

## 2014-12-21 NOTE — Progress Notes (Signed)
Pre visit review using our clinic review tool, if applicable. No additional management support is needed unless otherwise documented below in the visit note. 

## 2014-12-21 NOTE — Assessment & Plan Note (Signed)
Reports good compliance with Synthroid, refill provided, check a TSH

## 2014-12-21 NOTE — Assessment & Plan Note (Signed)
Based on last FLP I recommend medication, the patient's sister, POA, elected diet and exercise. Labs today.

## 2014-12-23 ENCOUNTER — Telehealth: Payer: Self-pay | Admitting: Internal Medicine

## 2014-12-23 MED ORDER — ATORVASTATIN CALCIUM 20 MG PO TABS: 20.0000 mg | ORAL_TABLET | Freq: Every day | ORAL | Status: DC

## 2014-12-23 NOTE — Addendum Note (Signed)
Addended by: Janalee Dane C on: 12/23/2014 11:16 AM   Modules accepted: Orders

## 2014-12-23 NOTE — Telephone Encounter (Signed)
Caller name: Blackwelder,[name] Relation to pt: emergency contact  Call back number: [phone]   Reason for call:  Pt inquiring about x ray and lab results.

## 2014-12-24 NOTE — Telephone Encounter (Signed)
Spoke with Dean Dennis, informed her of Pts lab results. Informed her of cholesterol and beginning Lipitor (sent to [facility]), and to return in 2 months for fasting labs. [name] made lab appt for 02/17/2015 at [facility].

## 2014-12-28 ENCOUNTER — Telehealth: Payer: Self-pay | Admitting: Internal Medicine

## 2014-12-28 NOTE — Telephone Encounter (Signed)
Caller name:Theresa Glauser Relation to RT:MYTR Call back number:[phone] Pharmacy:  Reason for call: pt's sister would like for you to give her a call. States the pt is wanting to do a Quillian Quince fast, sister would like to know if this is a good ideal with his complications.

## 2014-12-28 NOTE — Telephone Encounter (Signed)
I recommend avoidance of fasting in such a fashion. Please see my previous recommendations

## 2014-12-28 NOTE — Telephone Encounter (Signed)
Please advise. Not sure as to what a "Quillian Quince FAST" is? Thanks.

## 2014-12-28 NOTE — Telephone Encounter (Signed)
We need more information about "Dean Dennis fast". If his intention is to lose weight, I recommend at least 30-40 minutes of daily physical activity along with a sensitive diet. The website of the [place]da has very good  suggestions for a  healthy diet, also nutritionist referral is okay if needed

## 2014-12-28 NOTE — Telephone Encounter (Signed)
Googled the term "Ileene Musa" it appears to be religious, "The Ileene Musa is a religious diet based on the Biblical Book of Quillian Quince, and commonly refers to a 10 or 21 day avoidance of foods declared unclean by God in the [employer]."

## 2014-12-29 NOTE — Telephone Encounter (Signed)
Spoke with Pts sister, Dean Dennis, recommendations given regarding "Dean Dennis", Dean Dennis verbalized understanding. Dean Dennis also stated that Pt enjoys grapefruit and was unsure if any of his medications would interact with grapefruit, informed her I did not know off the top of my head but would consult Dr. Larose Kells and let her know.

## 2014-12-29 NOTE — Telephone Encounter (Signed)
i don't think he needs to avoid grapefruit

## 2014-12-30 ENCOUNTER — Telehealth: Payer: Self-pay | Admitting: Internal Medicine

## 2014-12-30 NOTE — Telephone Encounter (Signed)
Caller name: debbie from [facility] Relation to pt: Call back number: [phone] Pharmacy:  Reason for call:   Jackelyn Poling stated patient started Quillian Quince diet in December and forgot to tell Dr. Larose Kells of this. Has been off of aspirin for some time but patient sister will be restarting today.

## 2014-12-30 NOTE — Telephone Encounter (Signed)
error:[id number] ° °

## 2014-12-30 NOTE — Telephone Encounter (Signed)
Already spoke with Pts sister in regards to "Ileene Musa." See phone notes from 12/28/2014.

## 2015-01-21 ENCOUNTER — Other Ambulatory Visit: Payer: Self-pay | Admitting: Internal Medicine

## 2015-01-21 NOTE — Telephone Encounter (Signed)
Caller name: Leona Relation to pt: self Call back number: [phone] Pharmacy: [facility] [address] Reason for call:   Patient requesting a refill of levothyroxine

## 2015-01-21 NOTE — Telephone Encounter (Signed)
Spoke with patient and sister (patient is learning disabled, so sister was helping to clarify) and sister stated that he was unaware he had refills at the pharmacy.  Notified sister that his Levothyroxine was refilled on 12/21/2014 for 90 pills and 2 refills.  She stated understanding.  Verified other medications as well, and all Rx are up-to date.  She will call if she has further questions.  eal

## 2015-02-17 ENCOUNTER — Other Ambulatory Visit (INDEPENDENT_AMBULATORY_CARE_PROVIDER_SITE_OTHER): Payer: Medicare Other

## 2015-02-17 DIAGNOSIS — E038 Other specified hypothyroidism: Secondary | ICD-10-CM

## 2015-02-17 DIAGNOSIS — E034 Atrophy of thyroid (acquired): Secondary | ICD-10-CM | POA: Diagnosis not present

## 2015-02-17 DIAGNOSIS — E785 Hyperlipidemia, unspecified: Secondary | ICD-10-CM

## 2015-02-17 DIAGNOSIS — I10 Essential (primary) hypertension: Secondary | ICD-10-CM | POA: Diagnosis not present

## 2015-02-17 LAB — LIPID PANEL
Cholesterol: 117 mg/dL (ref 0–200)
HDL: 46.3 mg/dL (ref >39.00)
LDL Cholesterol: 60 mg/dL (ref 0–99)
NonHDL: 70.7
Total CHOL/HDL Ratio: 3
Triglycerides: 56 mg/dL (ref 0.0–149.0)
VLDL: 11.2 mg/dL (ref 0.0–40.0)

## 2015-02-17 LAB — TSH: TSH: 0.72 u[IU]/mL (ref 0.35–4.50)

## 2015-02-17 LAB — AST: AST: 23 U/L (ref 0–37)

## 2015-02-17 LAB — ALT: ALT: 31 U/L (ref 0–53)

## 2015-03-25 ENCOUNTER — Telehealth: Payer: Self-pay | Admitting: *Deleted

## 2015-03-25 NOTE — Telephone Encounter (Signed)
Unable to reach patient at time of Pre-Visit Call.  Left message for patient to return call when available.    

## 2015-03-26 ENCOUNTER — Encounter: Payer: Self-pay | Admitting: Internal Medicine

## 2015-03-26 ENCOUNTER — Ambulatory Visit (INDEPENDENT_AMBULATORY_CARE_PROVIDER_SITE_OTHER): Payer: Medicare Other | Admitting: Internal Medicine

## 2015-03-26 VITALS — BP 124/68 | HR 56 | Temp 98.1°F | Ht 67.0 in | Wt 160.0 lb

## 2015-03-26 DIAGNOSIS — I10 Essential (primary) hypertension: Secondary | ICD-10-CM

## 2015-03-26 DIAGNOSIS — R209 Unspecified disturbances of skin sensation: Secondary | ICD-10-CM

## 2015-03-26 DIAGNOSIS — Z114 Encounter for screening for human immunodeficiency virus [HIV]: Secondary | ICD-10-CM | POA: Diagnosis not present

## 2015-03-26 DIAGNOSIS — Z0000 Encounter for general adult medical examination without abnormal findings: Secondary | ICD-10-CM | POA: Diagnosis not present

## 2015-03-26 DIAGNOSIS — Z23 Encounter for immunization: Secondary | ICD-10-CM | POA: Diagnosis not present

## 2015-03-26 DIAGNOSIS — E785 Hyperlipidemia, unspecified: Secondary | ICD-10-CM

## 2015-03-26 DIAGNOSIS — E034 Atrophy of thyroid (acquired): Secondary | ICD-10-CM

## 2015-03-26 LAB — CBC WITH DIFFERENTIAL/PLATELET
Basophils Absolute: 0 10*3/uL (ref 0.0–0.1)
Basophils Relative: 0.8 % (ref 0.0–3.0)
Eosinophils Absolute: 0.3 10*3/uL (ref 0.0–0.7)
Eosinophils Relative: 5.3 % — ABNORMAL HIGH (ref 0.0–5.0)
HCT: 40.9 % (ref 39.0–52.0)
Hemoglobin: 14.3 g/dL (ref 13.0–17.0)
Lymphocytes Relative: 27.5 % (ref 12.0–46.0)
Lymphs Abs: 1.5 10*3/uL (ref 0.7–4.0)
MCHC: 34.9 g/dL (ref 30.0–36.0)
MCV: 99.3 fl (ref 78.0–100.0)
Monocytes Absolute: 0.4 10*3/uL (ref 0.1–1.0)
Monocytes Relative: 7.3 % (ref 3.0–12.0)
Neutro Abs: 3.2 10*3/uL (ref 1.4–7.7)
Neutrophils Relative %: 59.1 % (ref 43.0–77.0)
Platelets: 225 10*3/uL (ref 150.0–400.0)
RBC: 4.12 Mil/uL — ABNORMAL LOW (ref 4.22–5.81)
RDW: 13.1 % (ref 11.5–15.5)
WBC: 5.4 10*3/uL (ref 4.0–10.5)

## 2015-03-26 LAB — BASIC METABOLIC PANEL
BUN: 23 mg/dL (ref 6–23)
CO2: 33 mEq/L — ABNORMAL HIGH (ref 19–32)
Calcium: 9.8 mg/dL (ref 8.4–10.5)
Chloride: 101 mEq/L (ref 96–112)
Creatinine, Ser: 1.18 mg/dL (ref 0.40–1.50)
GFR: 67.77 mL/min (ref >60.00)
Glucose, Bld: 89 mg/dL (ref 70–99)
Potassium: 3.8 mEq/L (ref 3.5–5.1)
Sodium: 136 mEq/L (ref 135–145)

## 2015-03-26 MED ORDER — LISINOPRIL-HYDROCHLOROTHIAZIDE 10-12.5 MG PO TABS: 1.0000 | ORAL_TABLET | Freq: Every day | ORAL | Status: DC

## 2015-03-26 MED ORDER — ATORVASTATIN CALCIUM 20 MG PO TABS: 20.0000 mg | ORAL_TABLET | Freq: Every day | ORAL | Status: DC

## 2015-03-26 MED ORDER — LEVOTHYROXINE SODIUM 100 MCG PO TABS: 100.0000 ug | ORAL_TABLET | Freq: Every day | ORAL | Status: DC

## 2015-03-26 NOTE — Assessment & Plan Note (Signed)
Occasionally nail beds turn blue, recommend cold avoidance

## 2015-03-26 NOTE — Assessment & Plan Note (Addendum)
Td 09 PNM shot-- today prevnar-- discuss next year zostavax  - 2014  Prostate cancer screening: DRE 11/2013 negative, PSA normal at that time. Colon cancer screening, colonoscopy 11-2013, had polyps, next in 10 years diet-exercise discussed  Labs reviewed. Due for a BMP and CBC Continue with ASA due to FH of CAD

## 2015-03-26 NOTE — Progress Notes (Signed)
Pre visit review using our clinic review tool, if applicable. No additional management support is needed unless otherwise documented below in the visit note. 

## 2015-03-26 NOTE — Assessment & Plan Note (Signed)
Under excellent control, check a BMP, follow-up 8 months

## 2015-03-26 NOTE — Patient Instructions (Signed)
Get your blood work before you leave    Come back to the office in 8 months   for a routine check up      [place] cause injuries and can affect all age groups. It is possible to use preventive measures to significantly decrease the likelihood of falls. There are many simple measures which can make your home safer and prevent falls. OUTDOORS  Repair cracks and edges of walkways and driveways.  Remove high doorway thresholds.  Trim shrubbery on the main path into your home.  Have good outside lighting.  Clear walkways of tools, rocks, debris, and clutter.  Check that handrails are not broken and are securely fastened. Both sides of steps should have handrails.  Have leaves, snow, and ice cleared regularly.  Use sand or salt on walkways during winter months.  In the garage, clean up grease or oil spills. BATHROOM  Install night lights.  Install grab bars by the toilet and in the tub and shower.  Use non-skid mats or decals in the tub or shower.  Place a plastic non-slip stool in the shower to sit on, if needed.  Keep floors dry and clean up all water on the floor immediately.  Remove soap buildup in the tub or shower on a regular basis.  Secure bath mats with non-slip, double-sided rug tape.  Remove throw rugs and tripping hazards from the floors. BEDROOMS  Install night lights.  Make sure a bedside light is easy to reach.  Do not use oversized bedding.  Keep a telephone by your bedside.  Have a firm chair with side arms to use for getting dressed.  Remove throw rugs and tripping hazards from the floor. KITCHEN  Keep handles on pots and pans turned toward the center of the stove. Use back burners when possible.  Clean up spills quickly and allow time for drying.  Avoid walking on wet floors.  Avoid hot utensils and knives.  Position shelves so they are not too high or low.  Place commonly used objects within easy  reach.  If necessary, use a sturdy step stool with a grab bar when reaching.  Keep electrical cables out of the way.  Do not use floor polish or wax that makes floors slippery. If you must use wax, use non-skid floor wax.  Remove throw rugs and tripping hazards from the floor. STAIRWAYS  Never leave objects on stairs.  Place handrails on both sides of stairways and use them. Fix any loose handrails. Make sure handrails on both sides of the stairways are as long as the stairs.  Check carpeting to make sure it is firmly attached along stairs. Make repairs to worn or loose carpet promptly.  Avoid placing throw rugs at the top or bottom of stairways, or properly secure the rug with carpet tape to prevent slippage. Get rid of throw rugs, if possible.  Have an electrician put in a light switch at the top and bottom of the stairs. OTHER FALL PREVENTION TIPS  Wear low-heel or rubber-soled shoes that are supportive and fit well. Wear closed toe shoes.  When using a stepladder, make sure it is fully opened and both spreaders are firmly locked. Do not climb a closed stepladder.  Add color or contrast paint or tape to grab bars and handrails in your home. Place contrasting color strips on first and last steps.  Learn and use mobility aids as needed. Install an electrical emergency response system.  Turn on  lights to avoid dark areas. Replace light bulbs that burn out immediately. Get light switches that glow.  Arrange furniture to create clear pathways. Keep furniture in the same place.  Firmly attach carpet with non-skid or double-sided tape.  Eliminate uneven floor surfaces.  Select a carpet pattern that does not visually hide the edge of steps.  Be aware of all pets. OTHER HOME SAFETY TIPS  Set the water temperature for 120 F (48.8 C).  Keep emergency numbers on or near the telephone.  Keep smoke detectors on every level of the home and near sleeping areas. Document Released:  12/01/2002 Document Revised: 06/11/2012 Document Reviewed: 03/01/2012 [facility] Patient Information 2015 [place], [place]. This information is not intended to replace advice given to you by your health care provider. Make sure you discuss any questions you have with your health care provider.   Preventive Care for Adults Ages 35 and over  Blood pressure check.** / Every 1 to 2 years.  Lipid and cholesterol check.**/ Every 5 years beginning at age 29.  Lung cancer screening. / Every year if you are aged 70-80 years and have a 30-pack-year history of smoking and currently smoke or have quit within the past 15 years. Yearly screening is stopped once you have quit smoking for at least 15 years or develop a health problem that would prevent you from having lung cancer treatment.  Fecal occult blood test (FOBT) of stool. / Every year beginning at age 46 and continuing until age 55. You may not have to do this test if you get a colonoscopy every 10 years.  Flexible sigmoidoscopy** or colonoscopy.** / Every 5 years for a flexible sigmoidoscopy or every 10 years for a colonoscopy beginning at age 64 and continuing until age 6.  Hepatitis C blood test.** / For all people born from [birth year] through 1963 and any individual with known risks for hepatitis C.  Abdominal aortic aneurysm (AAA) screening.** / A one-time screening for ages 52 to 18 years who are current or former smokers.  Skin self-exam. / Monthly.  Influenza vaccine. / Every year.  Tetanus, diphtheria, and acellular pertussis (Tdap/Td) vaccine.** / 1 dose of Td every 10 years.  Varicella vaccine.** / Consult your health care provider.  Zoster vaccine.** / 1 dose for adults aged 47 years or older.  Pneumococcal 13-valent conjugate (PCV13) vaccine.** / Consult your health care provider.  Pneumococcal polysaccharide (PPSV23) vaccine.** / 1 dose for all adults aged 23 years and older.  Meningococcal vaccine.** / Consult your health care  provider.  Hepatitis A vaccine.** / Consult your health care provider.  Hepatitis B vaccine.** / Consult your health care provider.  Haemophilus influenzae type b (Hib) vaccine.** / Consult your health care provider. **Family history and personal history of risk and conditions may change your health care provider's recommendations. Document Released: 02/06/2002 Document Revised: 12/16/2013 Document Reviewed: 05/08/2011 [facility] Patient Information 2015 [place], [place]. This information is not intended to replace advice given to you by your health care provider. Make sure you discuss any questions you have with your health care provider.

## 2015-03-26 NOTE — Assessment & Plan Note (Signed)
Good compliance with medication, excellent response to Lipitor, recent FLP great. No change

## 2015-03-26 NOTE — Assessment & Plan Note (Signed)
Recent TSH normal, no change, refill medicines

## 2015-03-26 NOTE — Progress Notes (Signed)
Subjective:    Patient ID: Dean Dennis, male    DOB: 1956-10-30, 59 y.o.   MRN: [id number]  DOS:  03/26/2015 Type of visit - description : cpx, her w/ Dean Dennis Here for Medicare AWV:  1. Risk factors based on Past M, S, F history: reviewed 2. Physical Activities: no routine exercise  , occ rides his bike  3. Depression/mood: neg screening  4. Hearing:  No problems noted or reported  5. ADL's: independent, does not drive  6. Fall Risk: no recent falls, prevention discussed  7. home Safety: does feel safe at home  8. Height, weight, & visual acuity: see VS, due to see the  eye doctor  9. Counseling: provided 10. Labs ordered based on risk factors: if needed  11. Referral Coordination: if needed 12. Care Plan, see assessment and plan , written personalized plan provided  13. Cognitive Assessment: motor skills and cognition appropriate for age 39. Care team updated-- does not see other MDs 15. End-of-life care: healthcare POA: sisters   In addition, today we discussed the following: High chol--Good compliance with Lipitor Hypothyroidism, on Synthroid, good compliance. Hypertension, BP today is very good, not ambulatory BPs, on medications.   Review of Systems  Constitutional: No fever, chills. No unexplained wt changes. No unusual sweats HEENT: No dental problems, ear discharge, facial swelling, voice changes. No eye discharge, redness or intolerance to light Respiratory: No wheezing or difficulty breathing. No cough , mucus production Cardiovascular: No CP, leg swelling or palpitations GI: no nausea, vomiting, diarrhea or abdominal pain.  No blood in the stools. No dysphagia   Endocrine: No polyphagia, polyuria or polydipsia GU: No dysuria, gross hematuria, difficulty urinating. No urinary urgency or frequency. Musculoskeletal: No joint swellings or unusual aches or pains Skin:  On cold days, has numbness on the hands, nailbeds turn blue but not white. Allergic, immunologic:  No environmental allergies or food allergies Neurological: No dizziness or syncope. No headaches. No diplopia, slurred speech, motor deficits, facial numbness Hematological: No enlarged lymph nodes, easy bruising or bleeding Psychiatry: No suicidal ideas, hallucinations, behavior problems or confusion. No unusual/severe anxiety or depression.    Past Medical History  Diagnosis Date  . Learning disability     evaluated in the past by psych "borderline functioning range of inttelligence  . Hypothyroidism   . Seizure 09/2005    saw neuro, they didnt think he had sz (-) EEG  . HTN (hypertension)   . Recurrent UTI 09/02/2013    Past Surgical History  Procedure Laterality Date  . Tonsillectomy    . Vasectomy  2004    History   Social History  . Marital Status: Single    Spouse Name: N/A  . Number of Children: 0  . Years of Education: N/A   Occupational History  . disability    Social History Main Topics  . Smoking status: Never Smoker   . Smokeless tobacco: Never Used  . Alcohol Use: No  . Drug Use: No  . Sexual Activity: Not on file   Other Topics Concern  . Not on file   Social History Narrative   Lost his mother , lives by himself, sisters check on him    Has 2 sister Dean Dennis [phone]), Dean Dennis ([phone])----> they are the POA    ADL- cooks his own meals, independent, not driving    Oc  plays the banjo    Family History  Problem Relation Age of Onset  . Heart attack Father 74  .  Prostate cancer Neg Hx   . Colon cancer Neg Hx   . Diabetes Neg Hx   . Lung cancer Mother     remote smoker         Medication List       This list is accurate as of: 03/26/15 11:59 PM.  Always use your most recent med list.               aspirin 81 MG tablet  Take 81 mg by mouth daily.     atorvastatin 20 MG tablet  Commonly known as:  LIPITOR  Take 1 tablet (20 mg total) by mouth daily.     levothyroxine 100 MCG tablet  Commonly known as:  SYNTHROID  Take 1 tablet  (100 mcg total) by mouth daily before breakfast.     lisinopril-hydrochlorothiazide 10-12.5 MG per tablet  Commonly known as:  PRINZIDE,ZESTORETIC  Take 1 tablet by mouth daily.           Objective:   Physical Exam BP 124/68 mmHg  Pulse 56  Temp(Src) 98.1 F (36.7 C) (Oral)  Ht 5\' 7"  (1.702 m)  Wt 160 lb (72.576 kg)  BMI 25.05 kg/m2  SpO2 99%  General:   Well developed, well nourished . NAD.  Neck:  Full range of motion. Supple. No  Thyromegaly  HEENT:  Normocephalic . Face symmetric, atraumatic Lungs:  CTA B Normal respiratory effort, no intercostal retractions, no accessory muscle use. Heart: RRR,  no murmur.  Abdomen:  Not distended, soft, non-tender. No rebound or rigidity. No mass,organomegaly Muscle skeletal: no pretibial edema bilaterally  Hands-nails- normal to inspection on palpation Skin: Exposed areas without rash. Not pale. Not jaundice Neurologic:  alert & oriented X3.  Speech normal, gait appropriate for age and unassisted Strength symmetric and appropriate for age.  Psych: Behavior appropriate-- history of learning disability. No anxious or depressed appearing.       Assessment & Plan:

## 2015-03-27 LAB — HIV ANTIBODY (ROUTINE TESTING W REFLEX): HIV 1&2 Ab, 4th Generation: NONREACTIVE

## 2015-07-05 ENCOUNTER — Other Ambulatory Visit: Payer: Self-pay

## 2015-07-05 MED ORDER — LEVOTHYROXINE SODIUM 100 MCG PO TABS: 100.0000 ug | ORAL_TABLET | Freq: Every day | ORAL | Status: DC

## 2015-07-05 MED ORDER — ATORVASTATIN CALCIUM 20 MG PO TABS: 20.0000 mg | ORAL_TABLET | Freq: Every day | ORAL | Status: DC

## 2015-07-05 MED ORDER — LISINOPRIL-HYDROCHLOROTHIAZIDE 10-12.5 MG PO TABS: 1.0000 | ORAL_TABLET | Freq: Every day | ORAL | Status: DC

## 2015-09-02 IMAGING — CR DG CHEST 2V
2 series · 2 of 2 positions shown · non-contrast
Comparison: None.

CLINICAL DATA: Left posterior rib pain for a week. No known injury.
Cough. Nonsmoker.

EXAM:
CHEST  2 VIEW

[w chest pa]
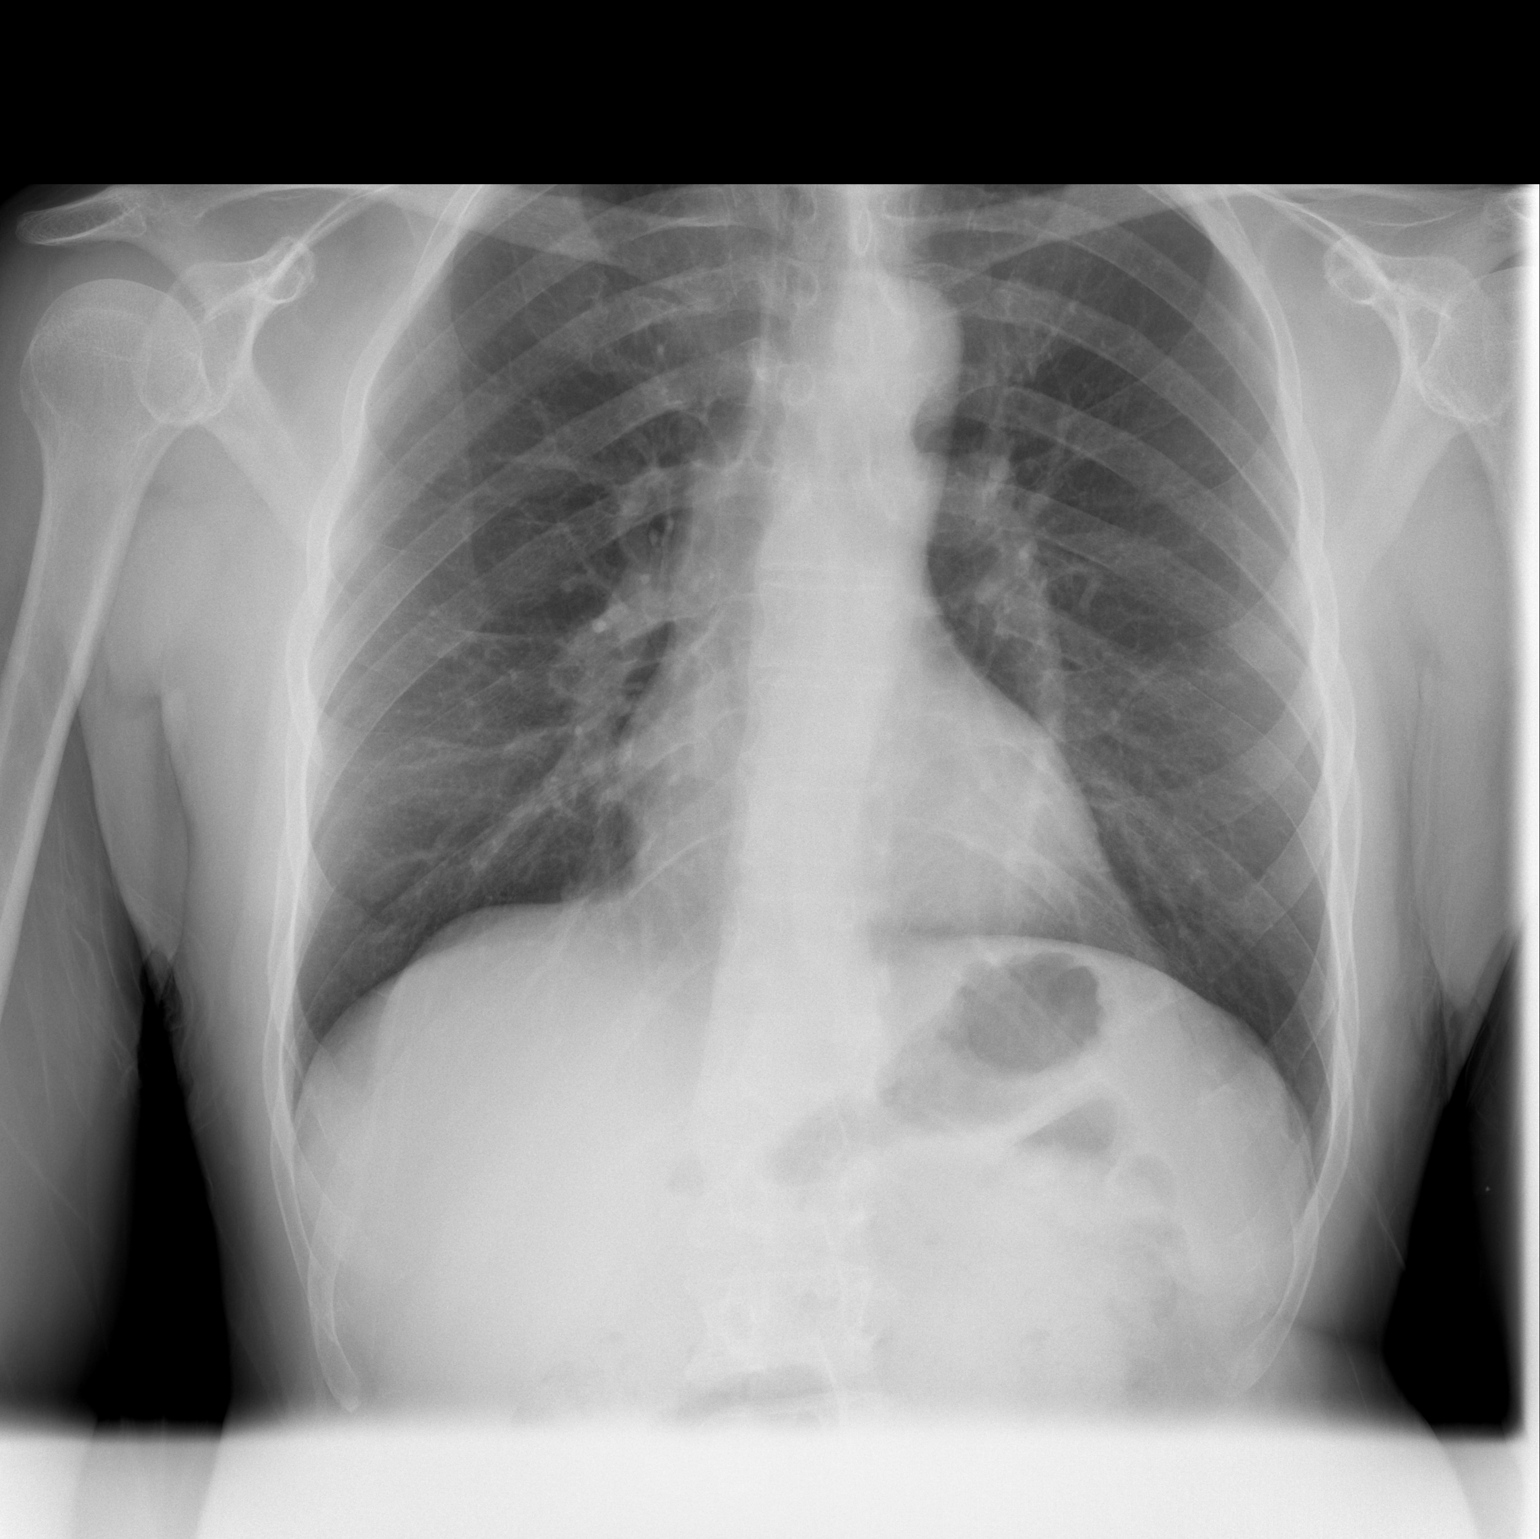

[w chest lat]
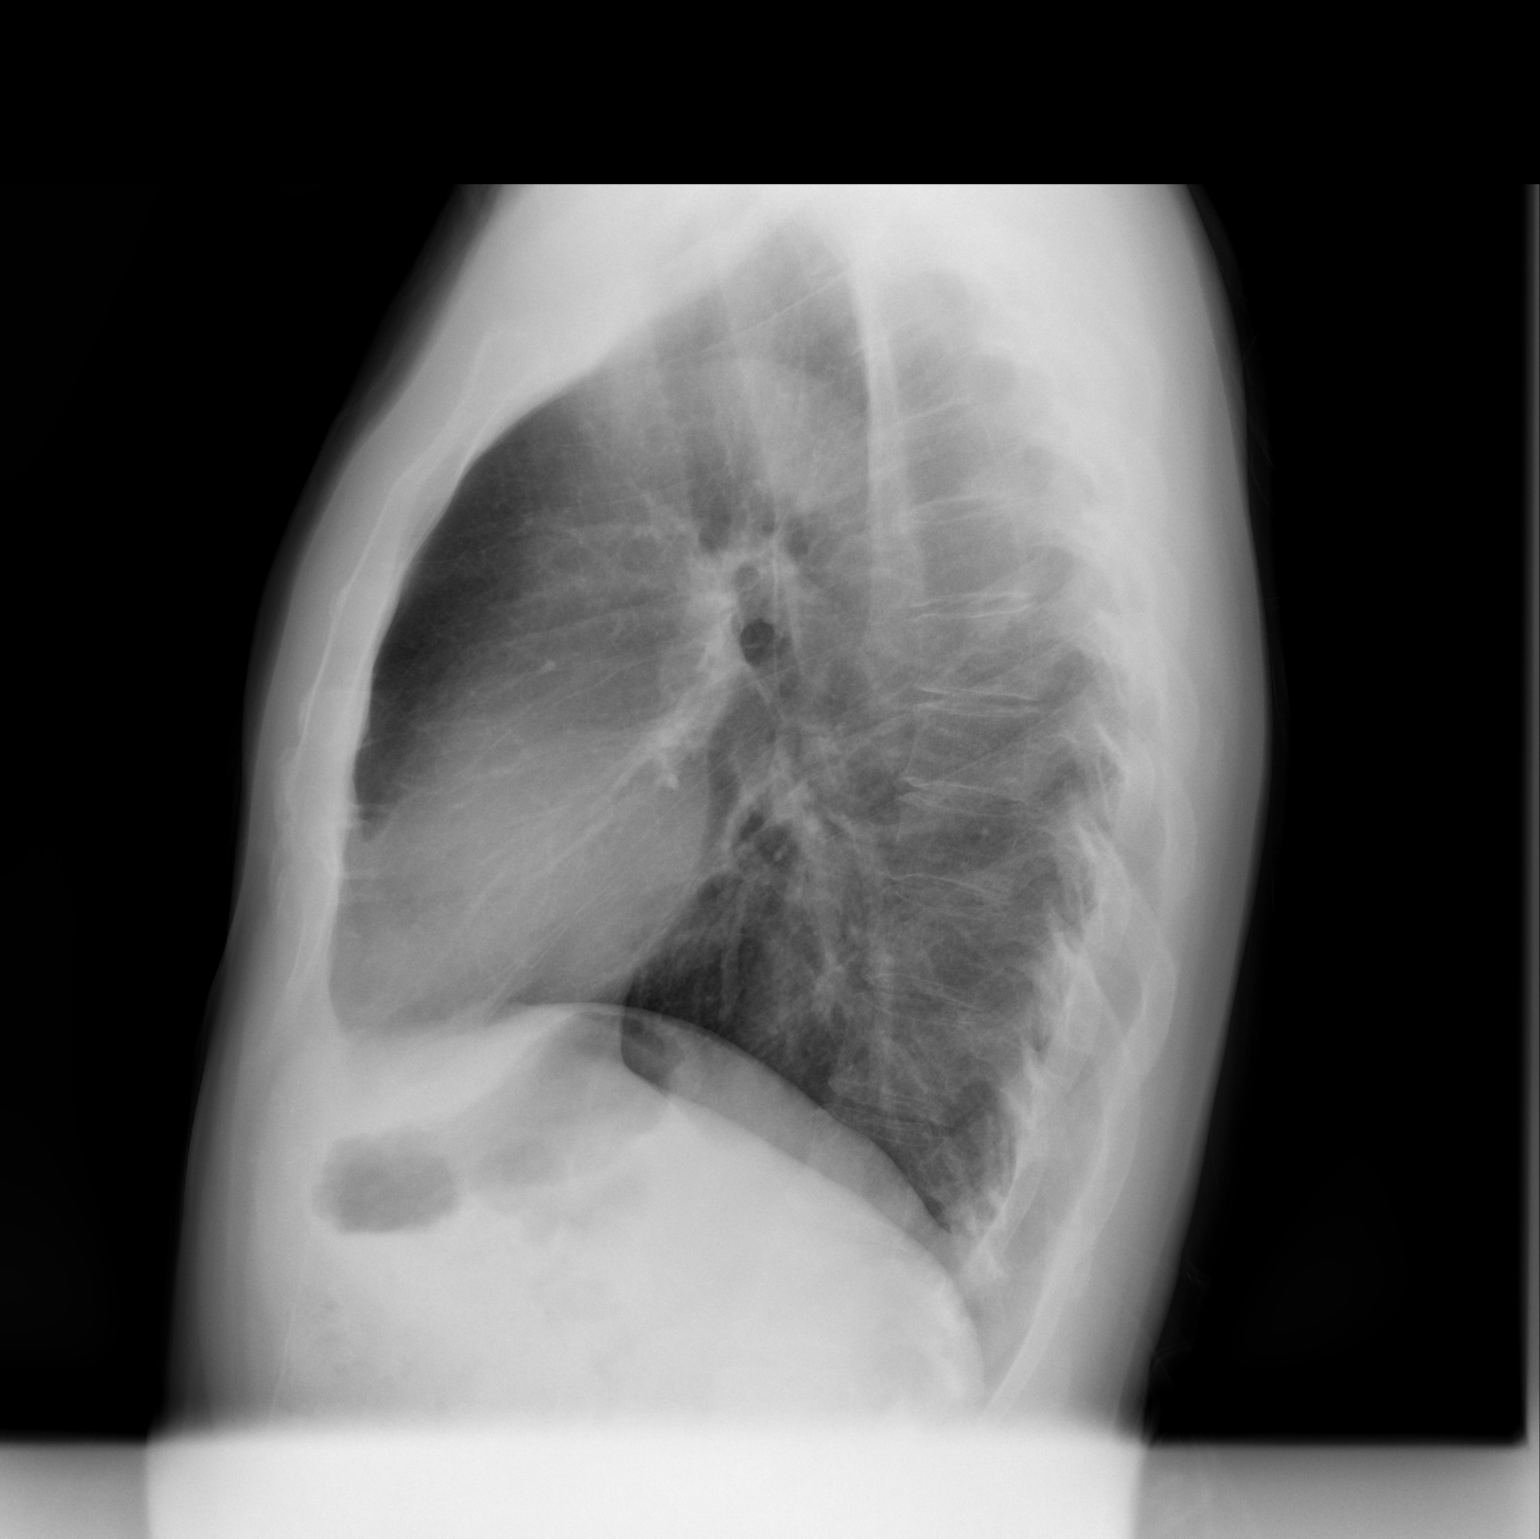

[2 of 2 positions shown; findings below may reference images not displayed]

FINDINGS: The heart size and mediastinal contours are within normal limits.
Both lungs are clear. The visualized skeletal structures are
unremarkable.
IMPRESSION: No active cardiopulmonary disease.

## 2016-01-07 ENCOUNTER — Other Ambulatory Visit: Payer: Self-pay | Admitting: Internal Medicine

## 2016-01-07 NOTE — Telephone Encounter (Signed)
Relation to [id number] Call back number:[phone] Pharmacy: [facility] [place] [address] [phone] (Phone) [phone] (Fax)         Reason for call:   Patient requesting a refill atorvastatin (LIPITOR) 20 MG tablet and lisinopril-hydrochlorothiazide (PRINZIDE,ZESTORETIC) 10-12.5 MG per tablet

## 2016-01-07 NOTE — Telephone Encounter (Signed)
Rx's sent, overdue for routine F/U, no further refills until seen. Thank you.

## 2016-01-10 NOTE — Telephone Encounter (Signed)
Patient scheduled for 01/19/2016

## 2016-01-19 ENCOUNTER — Encounter: Payer: Self-pay | Admitting: Internal Medicine

## 2016-01-19 ENCOUNTER — Ambulatory Visit (INDEPENDENT_AMBULATORY_CARE_PROVIDER_SITE_OTHER): Payer: Medicare Other | Admitting: Internal Medicine

## 2016-01-19 VITALS — BP 126/74 | HR 53 | Temp 98.4°F | Ht 67.0 in | Wt 160.5 lb

## 2016-01-19 DIAGNOSIS — E785 Hyperlipidemia, unspecified: Secondary | ICD-10-CM

## 2016-01-19 DIAGNOSIS — I10 Essential (primary) hypertension: Secondary | ICD-10-CM

## 2016-01-19 DIAGNOSIS — Z23 Encounter for immunization: Secondary | ICD-10-CM | POA: Diagnosis not present

## 2016-01-19 DIAGNOSIS — E039 Hypothyroidism, unspecified: Secondary | ICD-10-CM | POA: Diagnosis not present

## 2016-01-19 LAB — TSH: TSH: 0.97 u[IU]/mL (ref 0.35–4.50)

## 2016-01-19 LAB — BASIC METABOLIC PANEL
BUN: 16 mg/dL (ref 6–23)
CO2: 33 mEq/L — ABNORMAL HIGH (ref 19–32)
Calcium: 9.6 mg/dL (ref 8.4–10.5)
Chloride: 100 mEq/L (ref 96–112)
Creatinine, Ser: 1.23 mg/dL (ref 0.40–1.50)
GFR: 64.42 mL/min (ref >60.00)
Glucose, Bld: 88 mg/dL (ref 70–99)
Potassium: 3.8 mEq/L (ref 3.5–5.1)
Sodium: 140 mEq/L (ref 135–145)

## 2016-01-19 LAB — LIPID PANEL
Cholesterol: 122 mg/dL (ref 0–200)
HDL: 54 mg/dL (ref >39.00)
LDL Cholesterol: 48 mg/dL (ref 0–99)
NonHDL: 68.11
Total CHOL/HDL Ratio: 2
Triglycerides: 99 mg/dL (ref 0.0–149.0)
VLDL: 19.8 mg/dL (ref 0.0–40.0)

## 2016-01-19 LAB — ALT: ALT: 25 U/L (ref 0–53)

## 2016-01-19 LAB — AST: AST: 19 U/L (ref 0–37)

## 2016-01-19 MED ORDER — ATORVASTATIN CALCIUM 20 MG PO TABS: 20.0000 mg | ORAL_TABLET | Freq: Every day | ORAL | Status: DC

## 2016-01-19 MED ORDER — LISINOPRIL-HYDROCHLOROTHIAZIDE 10-12.5 MG PO TABS: 1.0000 | ORAL_TABLET | Freq: Every day | ORAL | Status: DC

## 2016-01-19 MED ORDER — LEVOTHYROXINE SODIUM 100 MCG PO TABS: 100.0000 ug | ORAL_TABLET | Freq: Every day | ORAL | Status: DC

## 2016-01-19 NOTE — Progress Notes (Signed)
Pre visit review using our clinic review tool, if applicable. No additional management support is needed unless otherwise documented below in the visit note. 

## 2016-01-19 NOTE — Progress Notes (Signed)
   Subjective:    Patient ID: Dean Dennis, male    DOB: 03/03/1956, 60 y.o.   MRN: [id number]  DOS:  01/19/2016 Type of visit - description : Routine visit Interval history: Here with his sister, no concerns, doing well, good compliance with medications, no ambulatory BPs   Review of Systems  Denies chest pain or difficulty breathing No nausea, vomiting, diarrhea No anxiety or depression.  Past Medical History  Diagnosis Date  . Learning disability     evaluated in the past by psych "borderline functioning range of inttelligence  . Hypothyroidism   . Seizure ([place]) 09/2005    saw neuro, they didnt think he had sz (-) EEG  . HTN (hypertension)   . Recurrent UTI 09/02/2013    Past Surgical History  Procedure Laterality Date  . Tonsillectomy    . Vasectomy  2004    Social History   Social History  . Marital Status: Single    Spouse Name: N/A  . Number of Children: 0  . Years of Education: N/A   Occupational History  . disability    Social History Main Topics  . Smoking status: Never Smoker   . Smokeless tobacco: Never Used  . Alcohol Use: No  . Drug Use: No  . Sexual Activity: Not on file   Other Topics Concern  . Not on file   Social History Narrative   Lost his mother , lives by himself, sisters check on him    Has 2 sister [name] [phone]), [name] [phone])----> they are the POA    ADL- cooks his own meals, independent, not driving    Occ  plays the banjo         Medication List       This list is accurate as of: 01/19/16 11:59 PM.  Always use your most recent med list.               aspirin 81 MG tablet  Take 81 mg by mouth daily. Reported on 01/19/2016     atorvastatin 20 MG tablet  Commonly known as:  LIPITOR  Take 1 tablet (20 mg total) by mouth daily.     levothyroxine 100 MCG tablet  Commonly known as:  SYNTHROID  Take 1 tablet (100 mcg total) by mouth daily before breakfast.     lisinopril-hydrochlorothiazide 10-12.5 MG  tablet  Commonly known as:  PRINZIDE,ZESTORETIC  Take 1 tablet by mouth daily.           Objective:   Physical Exam BP 126/74 mmHg  Pulse 53  Temp(Src) 98.4 F (36.9 C) (Oral)  Ht 5\' 7"  (1.702 m)  Wt 160 lb 8 oz (72.802 kg)  BMI 25.13 kg/m2  SpO2 99% General:   Well developed, well nourished . NAD.  HEENT:  Normocephalic . Face symmetric, atraumatic Lungs:  CTA B Normal respiratory effort, no intercostal retractions, no accessory muscle use. Heart: RRR,  no murmur.  No pretibial edema bilaterally  Skin: Not pale. Not jaundice Neurologic:  alert & oriented X3.  Speech normal, gait appropriate for age and unassisted Psych--  No anxious or depressed appearing.      Assessment & Plan:   Assessment HTN Hypothyroidism Recurrent UTIs ?Seizure, 2006 >>> saw neurology, EEG negative.not likely SZ  PLAN HTN: Continue lisinopril and HCTZ, check ambulatory BPs, check labs Hypothyroidism: cont Levothyroxine, check a TSH High cholesterol: Continue Lipitor, labs Continue aspirin for cardiovascular protection RTC 6 months, CPX

## 2016-01-19 NOTE — Patient Instructions (Signed)
BEFORE YOU LEAVE THE OFFICE: GO TO THE LAB  Get the blood work    GO TO THE FRONT DESK Schedule a complete physical exam to be done in 6 months  No  fasting      AFTER YOU LEAVE THE OFFICE:  Continue with the same medications including aspirin 81 mg once a day   Check the  blood pressure 2 or 3 times a month  Be sure your blood pressure is between 110/65 and  145/85. If it is consistently higher or lower, let me know

## 2016-07-11 ENCOUNTER — Telehealth: Payer: Self-pay | Admitting: Internal Medicine

## 2016-07-12 NOTE — Telephone Encounter (Signed)
Completed.

## 2016-07-19 ENCOUNTER — Encounter: Payer: Medicare Other | Admitting: Internal Medicine

## 2016-09-05 ENCOUNTER — Other Ambulatory Visit: Payer: Self-pay | Admitting: Internal Medicine

## 2016-09-27 ENCOUNTER — Ambulatory Visit (INDEPENDENT_AMBULATORY_CARE_PROVIDER_SITE_OTHER): Payer: Medicare Other | Admitting: Internal Medicine

## 2016-09-27 ENCOUNTER — Encounter: Payer: Self-pay | Admitting: Internal Medicine

## 2016-09-27 VITALS — BP 118/68 | HR 61 | Temp 98.0°F | Resp 14 | Ht 67.0 in | Wt 164.2 lb

## 2016-09-27 DIAGNOSIS — Z0000 Encounter for general adult medical examination without abnormal findings: Secondary | ICD-10-CM | POA: Diagnosis not present

## 2016-09-27 DIAGNOSIS — Z23 Encounter for immunization: Secondary | ICD-10-CM | POA: Diagnosis not present

## 2016-09-27 DIAGNOSIS — Z125 Encounter for screening for malignant neoplasm of prostate: Secondary | ICD-10-CM | POA: Diagnosis not present

## 2016-09-27 NOTE — Assessment & Plan Note (Addendum)
Td 09;  PNM shot 2016; prevnar--  09-2016;  zostavax  - 2014 Flu shot today Prostate cancer screening: DRE 09-2016: Question of nodule, very small, if PSA within normal will rec to recheck in one year.   Colon cancer screening, colonoscopy 11-2013,  Per letter 10 years diet-exercise discussed , also safety, needs to use a helmet consistently when bike riding  Labs  : BMP, AST, ALT, TSH, PSA Continue with ASA due to [place] of CAD

## 2016-09-27 NOTE — Progress Notes (Signed)
Subjective:    Patient ID: Dean Dennis, male    DOB: 06-15-1956, 60 y.o.   MRN: [id number]  DOS:  09/27/2016 Type of visit - description : cpx, here w/ [name] Interval history: Patient is doing well, living independently. Good med compliance.    Review of Systems  Constitutional: No fever. No chills. No unexplained wt changes. No unusual sweats  HEENT: No dental problems, no ear discharge, no facial swelling, no voice changes. No eye discharge, no eye  redness , no  intolerance to light   Respiratory: No wheezing , no  difficulty breathing. No cough , no mucus production  Cardiovascular: No CP, no leg swelling , no  Palpitations  GI: no nausea, no vomiting, no diarrhea , no  abdominal pain.  No blood in the stools. No dysphagia, no odynophagia    Endocrine: No polyphagia, no polyuria , no polydipsia  GU: No dysuria, gross hematuria, difficulty urinating. No urinary urgency, no frequency.  Musculoskeletal: No joint swellings or unusual aches or pains  Skin: No change in the color of the skin, palor , no  Rash  Allergic, immunologic: No environmental allergies , no  food allergies  Neurological: No dizziness no  syncope. No headaches. No diplopia, no slurred, no slurred speech, no motor deficits, no facial  Numbness  Hematological: No enlarged lymph nodes, no easy bruising , no unusual bleedings  Psychiatry: No suicidal ideas, no hallucinations, no beavior problems, no confusion.  No unusual/severe anxiety, no depression   Past Medical History:  Diagnosis Date  . HTN (hypertension)   . Hypothyroidism   . Learning disability    evaluated in the past by psych "borderline functioning range of inttelligence  . Recurrent UTI 09/02/2013  . Seizure ([place]) 09/2005   saw neuro, they didnt think he had sz (-) EEG    Past Surgical History:  Procedure Laterality Date  . TONSILLECTOMY    . VASECTOMY  2004    Social History   Social History  . Marital status: Single      Spouse name: N/A  . Number of children: 0  . Years of education: N/A   Occupational History  . disability    Social History Main Topics  . Smoking status: Never Smoker  . Smokeless tobacco: Never Used  . Alcohol use No  . Drug use: No  . Sexual activity: Not on file   Other Topics Concern  . Not on file   Social History Narrative   Lost his mother , lives by himself, sisters check on him    Has 2 sister [name] [phone]), [name] [phone])----> they are the POA    ADL- cooks his own meals, independent, not driving    Occ  plays the banjo      Family History  Problem Relation Age of Onset  . Heart attack Father 14  . Lung cancer Mother     remote smoker  . Prostate cancer Neg Hx   . Colon cancer Neg Hx   . Diabetes Neg Hx        Medication List       Accurate as of 09/27/16  3:33 PM. Always use your most recent med list.          aspirin 81 MG tablet Take 81 mg by mouth daily. Reported on 01/19/2016   atorvastatin 20 MG tablet Commonly known as:  LIPITOR Take 1 tablet (20 mg total) by mouth daily.   levothyroxine 100 MCG tablet Commonly  known as:  SYNTHROID Take 1 tablet (100 mcg total) by mouth daily before breakfast.   lisinopril-hydrochlorothiazide 10-12.5 MG tablet Commonly known as:  PRINZIDE,ZESTORETIC Take 1 tablet by mouth daily.          Objective:   Physical Exam BP 118/68 (BP Location: Left Arm, Patient Position: Sitting, Cuff Size: Normal)   Pulse 61   Temp 98 F (36.7 C) (Oral)   Resp 14   Ht 5\' 7"  (1.702 m)   Wt 164 lb 4 oz (74.5 kg)   SpO2 98%   BMI 25.73 kg/m   General:   Well developed, well nourished . NAD.  Neck: No  thyromegaly  HEENT:  Normocephalic . Face symmetric, atraumatic Lungs:  CTA B Normal respiratory effort, no intercostal retractions, no accessory muscle use. Heart: RRR,  no murmur.  No pretibial edema bilaterally  Abdomen:  Not distended, soft, non-tender. No rebound or rigidity.   Skin:  Exposed areas without rash. Not pale. Not jaundice Rectal:  External abnormalities: none. Normal sphincter tone. No rectal masses or tenderness.  No stools found  Prostate: Prostate gland firm and smooth, no enlargement: at the distal aspect of the left side of the prostate there is a 2 mm area that feels different, no indurated or tender. Not hard. Neurologic:  alert & oriented X3.  Speech normal, gait appropriate for age and unassisted Strength symmetric and appropriate for age.  Psych: Behavior appropriate. No anxious or depressed appearing.    Assessment & Plan:  Assessment HTN Hyperlipidemia  Hypothyroidism Recurrent UTIs Learning disability ?Seizure, 2006 >>> saw neurology, EEG negative.not likely SZ  PLAN HTN: Continue Zestoretic, check a BMP Hyperlipidemia: Last FLP satisfactory, check LFTs, continue Lipitor Hypothyroidism: On Synthroid, check a TSH Recurrent UTIs: Asx RTC - 6-7  Months

## 2016-09-27 NOTE — Patient Instructions (Signed)
GO TO THE LAB : Get the blood work     GO TO THE FRONT DESK Schedule your next appointment for a  routine checkup in 6 or 7 months  Use   a helmet every time you ride your bike

## 2016-09-27 NOTE — Progress Notes (Signed)
Pre visit review using our clinic review tool, if applicable. No additional management support is needed unless otherwise documented below in the visit note. 

## 2016-09-28 DIAGNOSIS — Z09 Encounter for follow-up examination after completed treatment for conditions other than malignant neoplasm: Secondary | ICD-10-CM | POA: Insufficient documentation

## 2016-09-28 LAB — BASIC METABOLIC PANEL
BUN: 21 mg/dL (ref 6–23)
CO2: 31 mEq/L (ref 19–32)
Calcium: 9.1 mg/dL (ref 8.4–10.5)
Chloride: 98 mEq/L (ref 96–112)
Creatinine, Ser: 1.43 mg/dL (ref 0.40–1.50)
GFR: 54 mL/min — ABNORMAL LOW (ref >60.00)
Glucose, Bld: 92 mg/dL (ref 70–99)
Potassium: 3.9 mEq/L (ref 3.5–5.1)
Sodium: 141 mEq/L (ref 135–145)

## 2016-09-28 LAB — AST: AST: 22 U/L (ref 0–37)

## 2016-09-28 LAB — PSA: PSA: 5.28 ng/mL — ABNORMAL HIGH (ref 0.10–4.00)

## 2016-09-28 LAB — TSH: TSH: 0.98 u[IU]/mL (ref 0.35–4.50)

## 2016-09-28 LAB — ALT: ALT: 32 U/L (ref 0–53)

## 2016-09-28 NOTE — Assessment & Plan Note (Signed)
HTN: Continue Zestoretic, check a BMP Hyperlipidemia: Last FLP satisfactory, check LFTs, continue Lipitor Hypothyroidism: On Synthroid, check a TSH Recurrent UTIs: Asx RTC - 6-7  Months

## 2016-10-30 ENCOUNTER — Other Ambulatory Visit: Payer: Self-pay | Admitting: Internal Medicine

## 2016-11-24 ENCOUNTER — Other Ambulatory Visit: Payer: Self-pay | Admitting: Internal Medicine

## 2017-03-07 ENCOUNTER — Other Ambulatory Visit: Payer: Self-pay | Admitting: Internal Medicine

## 2017-03-28 ENCOUNTER — Encounter: Payer: Self-pay | Admitting: Internal Medicine

## 2017-03-28 ENCOUNTER — Ambulatory Visit (INDEPENDENT_AMBULATORY_CARE_PROVIDER_SITE_OTHER): Payer: Medicare Other | Admitting: Internal Medicine

## 2017-03-28 VITALS — BP 124/70 | HR 64 | Temp 98.1°F | Resp 14 | Ht 67.0 in | Wt 168.4 lb

## 2017-03-28 DIAGNOSIS — I10 Essential (primary) hypertension: Secondary | ICD-10-CM

## 2017-03-28 DIAGNOSIS — E785 Hyperlipidemia, unspecified: Secondary | ICD-10-CM

## 2017-03-28 DIAGNOSIS — R972 Elevated prostate specific antigen [PSA]: Secondary | ICD-10-CM

## 2017-03-28 DIAGNOSIS — E034 Atrophy of thyroid (acquired): Secondary | ICD-10-CM | POA: Diagnosis not present

## 2017-03-28 DIAGNOSIS — Z1159 Encounter for screening for other viral diseases: Secondary | ICD-10-CM | POA: Diagnosis not present

## 2017-03-28 DIAGNOSIS — N390 Urinary tract infection, site not specified: Secondary | ICD-10-CM | POA: Diagnosis not present

## 2017-03-28 DIAGNOSIS — Z87440 Personal history of urinary (tract) infections: Secondary | ICD-10-CM | POA: Diagnosis not present

## 2017-03-28 DIAGNOSIS — Z0000 Encounter for general adult medical examination without abnormal findings: Secondary | ICD-10-CM

## 2017-03-28 LAB — URINALYSIS, ROUTINE W REFLEX MICROSCOPIC
Bilirubin Urine: NEGATIVE
Ketones, ur: NEGATIVE
Nitrite: NEGATIVE
Specific Gravity, Urine: 1.015 (ref 1.000–1.030)
Total Protein, Urine: NEGATIVE
Urine Glucose: NEGATIVE
Urobilinogen, UA: 0.2 (ref 0.0–1.0)
pH: 6 (ref 5.0–8.0)

## 2017-03-28 LAB — COMPREHENSIVE METABOLIC PANEL
ALT: 24 U/L (ref 0–53)
AST: 20 U/L (ref 0–37)
Albumin: 4.7 g/dL (ref 3.5–5.2)
Alkaline Phosphatase: 77 U/L (ref 39–117)
BUN: 23 mg/dL (ref 6–23)
CO2: 29 mEq/L (ref 19–32)
Calcium: 9.6 mg/dL (ref 8.4–10.5)
Chloride: 101 mEq/L (ref 96–112)
Creatinine, Ser: 1.21 mg/dL (ref 0.40–1.50)
GFR: 65.37 mL/min (ref >60.00)
Glucose, Bld: 86 mg/dL (ref 70–99)
Potassium: 4.3 mEq/L (ref 3.5–5.1)
Sodium: 137 mEq/L (ref 135–145)
Total Bilirubin: 1 mg/dL (ref 0.2–1.2)
Total Protein: 7.2 g/dL (ref 6.0–8.3)

## 2017-03-28 LAB — LIPID PANEL
Cholesterol: 127 mg/dL (ref 0–200)
HDL: 47 mg/dL (ref >39.00)
LDL Cholesterol: 67 mg/dL (ref 0–99)
NonHDL: 80.11
Total CHOL/HDL Ratio: 3
Triglycerides: 68 mg/dL (ref 0.0–149.0)
VLDL: 13.6 mg/dL (ref 0.0–40.0)

## 2017-03-28 LAB — CBC WITH DIFFERENTIAL/PLATELET
Basophils Absolute: 0.1 10*3/uL (ref 0.0–0.1)
Basophils Relative: 1.2 % (ref 0.0–3.0)
Eosinophils Absolute: 0.8 10*3/uL — ABNORMAL HIGH (ref 0.0–0.7)
Eosinophils Relative: 6.9 % — ABNORMAL HIGH (ref 0.0–5.0)
HCT: 40.3 % (ref 39.0–52.0)
Hemoglobin: 14.1 g/dL (ref 13.0–17.0)
Lymphocytes Relative: 15.7 % (ref 12.0–46.0)
Lymphs Abs: 1.7 10*3/uL (ref 0.7–4.0)
MCHC: 34.9 g/dL (ref 30.0–36.0)
MCV: 100.1 fl — ABNORMAL HIGH (ref 78.0–100.0)
Monocytes Absolute: 0.7 10*3/uL (ref 0.1–1.0)
Monocytes Relative: 6.8 % (ref 3.0–12.0)
Neutro Abs: 7.6 10*3/uL (ref 1.4–7.7)
Neutrophils Relative %: 69.4 % (ref 43.0–77.0)
Platelets: 221 10*3/uL (ref 150.0–400.0)
RBC: 4.02 Mil/uL — ABNORMAL LOW (ref 4.22–5.81)
RDW: 12.9 % (ref 11.5–15.5)
WBC: 10.9 10*3/uL — ABNORMAL HIGH (ref 4.0–10.5)

## 2017-03-28 LAB — TSH: TSH: 0.54 u[IU]/mL (ref 0.35–4.50)

## 2017-03-28 LAB — PSA: PSA: 2.43 ng/mL (ref 0.10–4.00)

## 2017-03-28 LAB — HEPATITIS C ANTIBODY: HCV Ab: NEGATIVE

## 2017-03-28 NOTE — Patient Instructions (Addendum)
GO TO THE LAB : Get the blood work     GO TO THE FRONT DESK Schedule your next appointment for a physical in 6 months    Follow up with Dr.Asiyah Pineau as directed.  Continue to eat heart healthy diet (full of fruits, vegetables, whole grains, lean protein, water--limit salt, fat, and sugar intake) and increase physical activity as tolerated.  Continue doing brain stimulating activities (puzzles, reading, adult coloring books, staying active) to keep memory sharp.

## 2017-03-28 NOTE — Progress Notes (Signed)
Subjective:    Patient ID: Dean Dennis, male    DOB: 02/27/1956, 61 y.o.   MRN: [id number]  DOS:  03/28/2017 Type of visit - description : rov, here with his sister Interval history: History of recent increased PSA, due for a recheck HTN: Good med compliance, normal BPs Hypothyroidism: Good compliance, needs a refill.    Review of Systems No fever chills No dysuria, gross hematuria, difficulty urinating. No chest pain or difficulty breathing.  Past Medical History:  Diagnosis Date  . HTN (hypertension)   . Hypothyroidism   . Learning disability    evaluated in the past by psych "borderline functioning range of inttelligence  . Recurrent UTI 09/02/2013  . Seizure ([place]) 09/2005   saw neuro, they didnt think he had sz (-) EEG    Past Surgical History:  Procedure Laterality Date  . TONSILLECTOMY    . VASECTOMY  2004    Social History   Social History  . Marital status: Single    Spouse name: N/A  . Number of children: 0  . Years of education: N/A   Occupational History  . disability    Social History Main Topics  . Smoking status: Never Smoker  . Smokeless tobacco: Never Used  . Alcohol use No  . Drug use: No  . Sexual activity: Not on file   Other Topics Concern  . Not on file   Social History Narrative   Lost his mother , lives by himself, sisters check on him    Has 2 sister [name] [phone]), [name] ([phone])----> they are the POA    ADL- cooks his own meals, independent, not driving    Occ  plays the banjo       Allergies as of 03/28/2017   No Known Allergies     Medication List       Accurate as of 03/28/17 11:59 PM. Always use your most recent med list.          aspirin 81 MG tablet Take 81 mg by mouth daily. Reported on 01/19/2016   atorvastatin 20 MG tablet Commonly known as:  LIPITOR Take 1 tablet (20 mg total) by mouth daily.   levothyroxine 100 MCG tablet Commonly known as:  SYNTHROID Take 1 tablet (100 mcg total) by mouth  daily before breakfast.   lisinopril-hydrochlorothiazide 10-12.5 MG tablet Commonly known as:  PRINZIDE,ZESTORETIC Take 1 tablet by mouth daily.          Objective:   Physical Exam BP 124/70 (BP Location: Left Arm, Patient Position: Sitting, Cuff Size: Normal)   Pulse 64   Temp 98.1 F (36.7 C) (Oral)   Resp 14   Ht 5\' 7"  (1.702 m)   Wt 168 lb 6 oz (76.4 kg)   SpO2 94%   BMI 26.37 kg/m  General:   Well developed, well nourished . NAD.  HEENT:  Normocephalic . Face symmetric, atraumatic Lungs:  CTA B Normal respiratory effort, no intercostal retractions, no accessory muscle use. Heart: RRR,  no murmur.  No pretibial edema bilaterally  Skin: Not pale. Not jaundice Rectal:  External abnormalities: none. Normal sphincter tone. No rectal masses or tenderness.  Stool brown  Prostate: Prostate gland firm and smooth, no enlargement, no clear-cut nodularity or asymmetry; no  tenderness Neurologic:  alert & oriented X3.  Speech normal, gait appropriate for age and unassisted Psych--  Cognition and judgment appear intact.  Cooperative with normal attention span and concentration.  Behavior appropriate. No anxious or  depressed appearing.      Assessment & Plan:   Assessment HTN Hyperlipidemia  Hypothyroidism Recurrent UTIs Learning disability ?Seizure, 2006 >>> saw neurology, EEG negative.not likely SZ  PLAN HTN: Continue Zestoretic, check a CMP, CBC Hyperlipidemia, continue Lipitor, check a FLP Hypothyroidism: Check a TSH, refill Synthroid Increased PSA: Last PSA was increased, patient is asx . DRE today is essentially normal. Will recheck a PSA, also a UA urine culture. consider a urology referral. FH not well know, all males died very early in life. Primary care: Recommend a Medicare wellness today, check a hep C RTC 6 months, CPX

## 2017-03-28 NOTE — Progress Notes (Signed)
Pre visit review using our clinic review tool, if applicable. No additional management support is needed unless otherwise documented below in the visit note. 

## 2017-03-28 NOTE — Progress Notes (Signed)
Subjective:   Dean Dennis is a 61 y.o. male who presents for Medicare Annual/Subsequent preventive examination. Accompanied by sister Dean Dennis.  Review of Systems:  No ROS.  Medicare Wellness Visit. Cardiac Risk Factors include: advanced age (>55men, >80 women);dyslipidemia;hypertension Sleep patterns: Sleeps 7-8 hrs per night. Feels rested. Home Safety/Smoke Alarms: Feels safe in home. Smoke alarms in place. .  Living environment; residence and Firearm Safety: Lives alone. Guns safely stored. Seat Belt Safety/Bike Helmet: Wears seat belt.   Counseling:   Eye Exam- Wears glasses. Eye doctor annually. Dental- no teeth. Doesn't wear dentures. Gum care discussed.  Male:   CCS-Last 11/26/13: 2 polyps removed were not pre-cancerous. Repeat screening in 10 yrs. PSA-  Lab Results  Component Value Date   PSA 5.28 (H) 09/27/2016   PSA 1.30 09/02/2013   PSA 3.44 09/10/2008        Objective:    Vitals: BP 124/70 (BP Location: Left Arm, Patient Position: Sitting, Cuff Size: Normal)   Pulse 64   Temp 98.1 F (36.7 C) (Oral)   Resp 14   Ht 5\' 7"  (1.702 m)   Wt 168 lb 6 oz (76.4 kg)   SpO2 94%   BMI 26.37 kg/m   Body mass index is 26.37 kg/m.  Tobacco History  Smoking Status  . Never Smoker  Smokeless Tobacco  . Never Used     Counseling given: No   Past Medical History:  Diagnosis Date  . HTN (hypertension)   . Hypothyroidism   . Learning disability    evaluated in the past by psych "borderline functioning range of inttelligence  . Recurrent UTI 09/02/2013  . Seizure ([place]) 09/2005   saw neuro, they didnt think he had sz (-) EEG   Past Surgical History:  Procedure Laterality Date  . TONSILLECTOMY    . VASECTOMY  2004   Family History  Problem Relation Age of Onset  . Heart attack Father 16  . Lung cancer Mother     remote smoker  . Prostate cancer Neg Hx   . Dean cancer Neg Hx   . Diabetes Neg Hx    History  Sexual Activity  . Sexual activity: Not on  file    Outpatient Encounter Prescriptions as of 03/28/2017  Medication Sig  . aspirin 81 MG tablet Take 81 mg by mouth daily. Reported on 01/19/2016  . atorvastatin (LIPITOR) 20 MG tablet Take 1 tablet (20 mg total) by mouth daily.  Marland Kitchen levothyroxine (SYNTHROID) 100 MCG tablet Take 1 tablet (100 mcg total) by mouth daily before breakfast.  . lisinopril-hydrochlorothiazide (PRINZIDE,ZESTORETIC) 10-12.5 MG tablet Take 1 tablet by mouth daily.   No facility-administered encounter medications on file as of 03/28/2017.     Activities of Daily Living In your present state of health, do you have any difficulty performing the following activities: 03/28/2017 09/27/2016  Hearing? N N  Vision? N N  Difficulty concentrating or making decisions? N Y  Walking or climbing stairs? N N  Dressing or bathing? N N  Doing errands, shopping? N Y  Conservation officer, nature and eating ? N -  Using the Toilet? N -  In the past six months, have you accidently leaked urine? N -  Do you have problems with loss of bowel control? N -  Managing your Medications? N -  Managing your Finances? N -  Housekeeping or managing your Housekeeping? N -  Some recent data might be hidden    Patient Care Team: Dean Branch, MD  as PCP - General Dean Bears, MD as Consulting Physician (Gastroenterology)   Assessment:    Physical assessment deferred to PCP.  Exercise Activities and Dietary recommendations Current Exercise Habits: Home exercise routine, Time (Minutes): 20, Frequency (Times/Week): 4, Weekly Exercise (Minutes/Week): 80, Intensity: Mild    Diet (meal preparation, eat out, water intake, caffeinated beverages, dairy products, fruits and vegetables): on average, 3 meals per day  24 Hour Recall: Breakfast: cereal. coffee Lunch: Pork chops Dinner: Frozen dinner and sweet potato. OJ Needs to work on drinking more water- importance discussed.  Goals      Patient Stated   . <enter goal here> (pt-stated)          Keep riding  bike and drink more water.      Fall Risk Fall Risk  03/28/2017 09/27/2016 09/02/2013  Falls in the past year? No No No   Depression Screen PHQ 2/9 Scores 03/28/2017 09/27/2016 09/02/2013  PHQ - 2 Score 0 0 0    Cognitive Function MMSE - Mini Mental State Exam 03/28/2017  Orientation to time 5  Orientation to Place 5  Registration 3  Attention/ Calculation 5  Recall 3  Language- name 2 objects 2  Language- repeat 1  Language- follow 3 step command 3  Language- read & follow direction 1  Write a sentence 1  Copy design 1  Total score 30        Immunization History  Administered Date(s) Administered  . Influenza Split 10/31/2011  . Influenza, Seasonal, Injecte, Preservative Fre 12/10/2012  . Influenza,inj,Quad PF,36+ Mos 12/21/2014, 01/19/2016, 09/27/2016  . Pneumococcal Conjugate-13 09/27/2016  . Pneumococcal Polysaccharide-23 03/26/2015  . Td 09/04/2008  . Zoster 09/02/2013   Screening Tests Health Maintenance  Topic Date Due  . Hepatitis C Screening  1956-05-18  . INFLUENZA VACCINE  07/25/2017  . TETANUS/TDAP  09/04/2018  . COLONOSCOPY  11/27/2023  . HIV Screening  Completed      Plan:     Follow up with Dr.Paz as directed.  Continue to eat heart healthy diet (full of fruits, vegetables, whole grains, lean protein, water--limit salt, fat, and sugar intake) and increase physical activity as tolerated.  Continue doing brain stimulating activities (puzzles, reading, adult coloring books, staying active) to keep memory sharp.   During the course of the visit the patient was educated and counseled about the following appropriate screening and preventive services:   Vaccines to include Pneumoccal, Influenza, Td, HCV  Cardiovascular Disease  Colorectal cancer screening  Diabetes screening  Prostate Cancer Screening  Glaucoma screening  Nutrition counseling    Patient Instructions (the written plan) was given to the patient.    Dean Dennis [place],  [place]  03/28/2017  Dean November, MD

## 2017-03-29 MED ORDER — LEVOTHYROXINE SODIUM 100 MCG PO TABS: 100.0000 ug | ORAL_TABLET | Freq: Every day | ORAL | 1 refills | Status: DC

## 2017-03-29 NOTE — Addendum Note (Signed)
Addended byDamita Dunnings D on: 03/29/2017 04:55 PM   Modules accepted: Orders

## 2017-03-29 NOTE — Assessment & Plan Note (Signed)
HTN: Continue Zestoretic, check a CMP, CBC Hyperlipidemia, continue Lipitor, check a FLP Hypothyroidism: Check a TSH, refill Synthroid Increased PSA: Last PSA was increased, patient is asx . DRE today is essentially normal. Will recheck a PSA, also a UA urine culture. consider a urology referral. FH not well know, all males died very early in life. Primary care: Recommend a Medicare wellness today, check a hep C RTC 6 months, CPX

## 2017-03-30 LAB — URINE CULTURE

## 2017-03-30 MED ORDER — SULFAMETHOXAZOLE-TRIMETHOPRIM 800-160 MG PO TABS: 1.0000 | ORAL_TABLET | Freq: Two times a day (BID) | ORAL | 0 refills | Status: DC

## 2017-03-30 NOTE — Addendum Note (Signed)
Addended byDamita Dunnings D on: 03/30/2017 12:48 PM   Modules accepted: Orders

## 2017-05-03 DIAGNOSIS — H25013 Cortical age-related cataract, bilateral: Secondary | ICD-10-CM | POA: Diagnosis not present

## 2017-05-03 DIAGNOSIS — H11423 Conjunctival edema, bilateral: Secondary | ICD-10-CM | POA: Diagnosis not present

## 2017-05-03 DIAGNOSIS — H2513 Age-related nuclear cataract, bilateral: Secondary | ICD-10-CM | POA: Diagnosis not present

## 2017-05-03 DIAGNOSIS — H40023 Open angle with borderline findings, high risk, bilateral: Secondary | ICD-10-CM | POA: Diagnosis not present

## 2017-05-03 DIAGNOSIS — H40033 Anatomical narrow angle, bilateral: Secondary | ICD-10-CM | POA: Diagnosis not present

## 2017-05-04 DIAGNOSIS — H40033 Anatomical narrow angle, bilateral: Secondary | ICD-10-CM | POA: Diagnosis not present

## 2017-05-04 DIAGNOSIS — H2513 Age-related nuclear cataract, bilateral: Secondary | ICD-10-CM | POA: Diagnosis not present

## 2017-07-06 ENCOUNTER — Telehealth: Payer: Self-pay | Admitting: Internal Medicine

## 2017-07-06 DIAGNOSIS — Z87440 Personal history of urinary (tract) infections: Secondary | ICD-10-CM

## 2017-07-06 NOTE — Telephone Encounter (Signed)
Please arrange a visit, need a UA, UCX, dx UTI

## 2017-07-09 NOTE — Telephone Encounter (Signed)
Letter printed and mailed to Pt.  

## 2017-10-02 ENCOUNTER — Ambulatory Visit (INDEPENDENT_AMBULATORY_CARE_PROVIDER_SITE_OTHER): Payer: Medicare Other | Admitting: Internal Medicine

## 2017-10-02 ENCOUNTER — Encounter: Payer: Self-pay | Admitting: Internal Medicine

## 2017-10-02 VITALS — BP 112/62 | HR 84 | Temp 98.2°F | Resp 14 | Ht 67.0 in | Wt 167.5 lb

## 2017-10-02 DIAGNOSIS — Z23 Encounter for immunization: Secondary | ICD-10-CM

## 2017-10-02 DIAGNOSIS — Z87440 Personal history of urinary (tract) infections: Secondary | ICD-10-CM

## 2017-10-02 DIAGNOSIS — Z0000 Encounter for general adult medical examination without abnormal findings: Secondary | ICD-10-CM

## 2017-10-02 DIAGNOSIS — E034 Atrophy of thyroid (acquired): Secondary | ICD-10-CM | POA: Diagnosis not present

## 2017-10-02 LAB — CBC WITH DIFFERENTIAL/PLATELET
Basophils Absolute: 0.1 10*3/uL (ref 0.0–0.1)
Basophils Relative: 2.2 % (ref 0.0–3.0)
Eosinophils Absolute: 0.3 10*3/uL (ref 0.0–0.7)
Eosinophils Relative: 3.9 % (ref 0.0–5.0)
HCT: 40.6 % (ref 39.0–52.0)
Hemoglobin: 14 g/dL (ref 13.0–17.0)
Lymphocytes Relative: 19.9 % (ref 12.0–46.0)
Lymphs Abs: 1.3 10*3/uL (ref 0.7–4.0)
MCHC: 34.4 g/dL (ref 30.0–36.0)
MCV: 100.2 fl — ABNORMAL HIGH (ref 78.0–100.0)
Monocytes Absolute: 0.6 10*3/uL (ref 0.1–1.0)
Monocytes Relative: 9.5 % (ref 3.0–12.0)
Neutro Abs: 4.2 10*3/uL (ref 1.4–7.7)
Neutrophils Relative %: 64.5 % (ref 43.0–77.0)
Platelets: 258 10*3/uL (ref 150.0–400.0)
RBC: 4.05 Mil/uL — ABNORMAL LOW (ref 4.22–5.81)
RDW: 12.9 % (ref 11.5–15.5)
WBC: 6.5 10*3/uL (ref 4.0–10.5)

## 2017-10-02 LAB — BASIC METABOLIC PANEL
BUN: 19 mg/dL (ref 6–23)
CO2: 31 mEq/L (ref 19–32)
Calcium: 9.8 mg/dL (ref 8.4–10.5)
Chloride: 99 mEq/L (ref 96–112)
Creatinine, Ser: 1.19 mg/dL (ref 0.40–1.50)
GFR: 66.52 mL/min (ref >60.00)
Glucose, Bld: 83 mg/dL (ref 70–99)
Potassium: 4.2 mEq/L (ref 3.5–5.1)
Sodium: 139 mEq/L (ref 135–145)

## 2017-10-02 LAB — URINALYSIS, ROUTINE W REFLEX MICROSCOPIC
Bilirubin Urine: NEGATIVE
Ketones, ur: NEGATIVE
Nitrite: NEGATIVE
Specific Gravity, Urine: 1.01 (ref 1.000–1.030)
Total Protein, Urine: NEGATIVE
Urine Glucose: NEGATIVE
Urobilinogen, UA: 0.2 (ref 0.0–1.0)
pH: 6.5 (ref 5.0–8.0)

## 2017-10-02 LAB — LIPID PANEL
Cholesterol: 125 mg/dL (ref 0–200)
HDL: 42.1 mg/dL (ref >39.00)
LDL Cholesterol: 67 mg/dL (ref 0–99)
NonHDL: 83.31
Total CHOL/HDL Ratio: 3
Triglycerides: 82 mg/dL (ref 0.0–149.0)
VLDL: 16.4 mg/dL (ref 0.0–40.0)

## 2017-10-02 LAB — TSH: TSH: 0.12 u[IU]/mL — ABNORMAL LOW (ref 0.35–4.50)

## 2017-10-02 LAB — PSA: PSA: 1.28 ng/mL (ref 0.10–4.00)

## 2017-10-02 NOTE — Progress Notes (Signed)
Subjective:    Patient ID: Dean Dennis, male    DOB: Jun 10, 1956, 61 y.o.   MRN: [id number]  DOS:  10/02/2017 Type of visit - description : cpx, here w/ his sister [name] Interval history: Good med compliance, no amb BPs   Review of Systems Reports actually no concerns. Was treated with antibiotic for a UTI, he was and remains asymptomatic. No fever chills, no dysuria or gross hematuria. No difficulty urinating   Other than above, a 14 point review of systems is negative       Past Medical History:  Diagnosis Date  . HTN (hypertension)   . Hypothyroidism   . Learning disability    evaluated in the past by psych "borderline functioning range of inttelligence  . Recurrent UTI 09/02/2013  . Seizure ([place]) 09/2005   saw neuro, they didnt think he had sz (-) EEG    Past Surgical History:  Procedure Laterality Date  . TONSILLECTOMY    . VASECTOMY  2004    Social History   Social History  . Marital status: Single    Spouse name: N/A  . Number of children: 0  . Years of education: N/A   Occupational History  . disability    Social History Main Topics  . Smoking status: Never Smoker  . Smokeless tobacco: Never Used  . Alcohol use No  . Drug use: No  . Sexual activity: Not on file   Other Topics Concern  . Not on file   Social History Narrative   Lost his mother , lives by himself, sisters check on him    Has 2 sister [name] [phone]), [name] ([phone])----> they are the POA    ADL- cooks his own meals, independent, not driving    Occ  plays the banjo    Family History  Problem Relation Age of Onset  . Heart attack Father 31  . Lung cancer Mother        remote smoker  . Prostate cancer Neg Hx   . Colon cancer Neg Hx   . Diabetes Neg Hx       Allergies as of 10/02/2017   No Known Allergies     Medication List       Accurate as of 10/02/17 11:59 PM. Always use your most recent med list.          aspirin 81 MG tablet Take 81 mg by mouth  daily. Reported on 01/19/2016   atorvastatin 20 MG tablet Commonly known as:  LIPITOR Take 1 tablet (20 mg total) by mouth daily.   levothyroxine 100 MCG tablet Commonly known as:  SYNTHROID Take 1 tablet (100 mcg total) by mouth daily before breakfast.   lisinopril-hydrochlorothiazide 10-12.5 MG tablet Commonly known as:  PRINZIDE,ZESTORETIC Take 1 tablet by mouth daily.          Objective:   Physical Exam BP 112/62 (BP Location: Left Arm, Patient Position: Sitting, Cuff Size: Small)   Pulse 84   Temp 98.2 F (36.8 C) (Oral)   Resp 14   Ht 5\' 7"  (1.702 m)   Wt 167 lb 8 oz (76 kg)   BMI 26.23 kg/m    General:   Well developed, well nourished . NAD.  Neck: No  thyromegaly  HEENT:  Normocephalic . Face symmetric, atraumatic Lungs:  CTA B Normal respiratory effort, no intercostal retractions, no accessory muscle use. Heart: RRR,  no murmur.  No pretibial edema bilaterally  Abdomen:  Not distended, soft, non-tender.  No rebound or rigidity.   Skin: Exposed areas without rash. Not pale. Not jaundice Neurologic:  alert & oriented X3.  Speech normal, gait appropriate for age and unassisted Strength symmetric and appropriate for age.  Psych: At baseline Behavior appropriate. No anxious or depressed appearing.    Assessment & Plan:  Assessment HTN Hyperlipidemia  Hypothyroidism Recurrent UTIs last visit with urology 2012. Previous eval: CT 2010 bilateral renal cysts; cysto 2009: Unremarkable Learning disability ?Seizure, 2006 >>> saw neurology, EEG negative.not likely SZ  PLAN HTN: Continue lisinopril HCT, checking a BMP, BP today is very good, no ambulatory BPs High cholesterol: Well-controlled, recheck a FLP. Continue Lipitor Hypothyroidism, check a TSH. Good med compliance. Increased PSA: Was seen 03-2017, w/u showed a E coli UTI, treated with Bactrim. PSA was 2.4, better than before. Recheck a PSA, UA, urine culture to document bacteriuria but keeping in mind  that he is asx and current guidelines recommend no treatment. RTC 6 months

## 2017-10-02 NOTE — Progress Notes (Signed)
Pre visit review using our clinic review tool, if applicable. No additional management support is needed unless otherwise documented below in the visit note. 

## 2017-10-02 NOTE — Patient Instructions (Addendum)
GO TO THE LAB : Get the blood work     GO TO THE FRONT DESK Schedule your next appointment for a   checkup in 6 months 

## 2017-10-02 NOTE — Assessment & Plan Note (Addendum)
-  Td 09;  PNM shot 2016; prevnar: 09-2016;zostavax  - 2014;  Flu shot today -Prostate cancer screening: DRE 09-2016: Question of nodule DRE repeated 2018 and it was normal. Last PSA was satisfactory. Currently asx check a PSA, UA and urine culture  to document bacteriuria but keeping in mind that he is asx and current guidelines recommend no treatment. -CCS: colonoscopy 11-2013,  Per letter 10 years -diet-exercise discussed, using his helmet consistently when bike riding  -Labs : BMP, CBC, TSH, PSA, UA, urine culture  , FLP Continue with ASA due to [place] of CAD

## 2017-10-03 NOTE — Assessment & Plan Note (Signed)
HTN: Continue lisinopril HCT, checking a BMP, BP today is very good, no ambulatory BPs High cholesterol: Well-controlled, recheck a FLP. Continue Lipitor Hypothyroidism, check a TSH. Good med compliance. Increased PSA: Was seen 03-2017, w/u showed a E coli UTI, treated with Bactrim. PSA was 2.4, better than before. Recheck a PSA, UA, urine culture to document bacteriuria but keeping in mind that he is asx and current guidelines recommend no treatment. RTC 6 months

## 2017-10-04 LAB — URINE CULTURE
MICRO NUMBER:: 81123154
SPECIMEN QUALITY:: ADEQUATE

## 2017-10-05 MED ORDER — LEVOTHYROXINE SODIUM 88 MCG PO TABS: 88.0000 ug | ORAL_TABLET | Freq: Every day | ORAL | 2 refills | Status: DC

## 2017-10-05 NOTE — Addendum Note (Signed)
Addended byDamita Dunnings D on: 10/05/2017 01:42 PM   Modules accepted: Orders

## 2017-11-07 ENCOUNTER — Other Ambulatory Visit: Payer: Self-pay | Admitting: Internal Medicine

## 2017-11-08 MED ORDER — LEVOTHYROXINE SODIUM 88 MCG PO TABS: 88.0000 ug | ORAL_TABLET | Freq: Every day | ORAL | 2 refills | Status: DC

## 2017-12-05 ENCOUNTER — Other Ambulatory Visit: Payer: Self-pay | Admitting: Internal Medicine

## 2018-02-04 ENCOUNTER — Other Ambulatory Visit: Payer: Self-pay | Admitting: Internal Medicine

## 2018-03-04 ENCOUNTER — Other Ambulatory Visit: Payer: Self-pay | Admitting: Internal Medicine

## 2018-04-02 ENCOUNTER — Ambulatory Visit: Payer: Medicare Other | Admitting: Internal Medicine

## 2018-04-08 ENCOUNTER — Other Ambulatory Visit: Payer: Self-pay | Admitting: Internal Medicine

## 2018-04-11 ENCOUNTER — Ambulatory Visit: Payer: Medicare Other | Admitting: Internal Medicine

## 2018-04-17 ENCOUNTER — Ambulatory Visit: Payer: Medicare Other | Admitting: Internal Medicine

## 2018-04-26 ENCOUNTER — Encounter: Payer: Self-pay | Admitting: Internal Medicine

## 2018-04-26 ENCOUNTER — Ambulatory Visit (INDEPENDENT_AMBULATORY_CARE_PROVIDER_SITE_OTHER): Payer: Medicare Other | Admitting: Internal Medicine

## 2018-04-26 VITALS — BP 126/66 | HR 58 | Temp 97.7°F | Resp 14 | Ht 67.0 in | Wt 169.5 lb

## 2018-04-26 DIAGNOSIS — I10 Essential (primary) hypertension: Secondary | ICD-10-CM

## 2018-04-26 DIAGNOSIS — E039 Hypothyroidism, unspecified: Secondary | ICD-10-CM | POA: Diagnosis not present

## 2018-04-26 LAB — TSH: TSH: 1.94 mIU/L (ref 0.40–4.50)

## 2018-04-26 NOTE — Patient Instructions (Signed)
GO TO THE LAB : Get the blood work     GO TO THE FRONT DESK Schedule your next appointment for a routine checkup by October 2019

## 2018-04-26 NOTE — Progress Notes (Signed)
Subjective:    Patient ID: Dean Dennis, male    DOB: 1956/11/17, 62 y.o.   MRN: [id number]  DOS:  04/26/2018 Type of visit - description : Follow-up Interval history: Since the last visit, he is feeling well.  Good compliance of medication, no ambulatory BPs.  Review of Systems  Denies fever, chills No dysuria, gross hematuria difficulty urinating No nausea, vomiting, diarrhea or constipation Past Medical History:  Diagnosis Date  . HTN (hypertension)   . Hypothyroidism   . Learning disability    evaluated in the past by psych "borderline functioning range of inttelligence  . Recurrent UTI 09/02/2013  . Seizure ([place]) 09/2005   saw neuro, they didnt think he had sz (-) EEG    Past Surgical History:  Procedure Laterality Date  . TONSILLECTOMY    . VASECTOMY  2004    Social History   Socioeconomic History  . Marital status: Single    Spouse name: Not on file  . Number of children: 0  . Years of education: Not on file  . Highest education level: Not on file  Occupational History  . Occupation: disability  Social Needs  . Financial resource strain: Not on file  . Food insecurity:    Worry: Not on file    Inability: Not on file  . Transportation needs:    Medical: Not on file    Non-medical: Not on file  Tobacco Use  . Smoking status: Never Smoker  . Smokeless tobacco: Never Used  Substance and Sexual Activity  . Alcohol use: No  . Drug use: No  . Sexual activity: Not on file  Lifestyle  . Physical activity:    Days per week: Not on file    Minutes per session: Not on file  . Stress: Not on file  Relationships  . Social connections:    Talks on phone: Not on file    Gets together: Not on file    Attends religious service: Not on file    Active member of club or organization: Not on file    Attends meetings of clubs or organizations: Not on file    Relationship status: Not on file  . Intimate partner violence:    Fear of current or ex partner: Not on  file    Emotionally abused: Not on file    Physically abused: Not on file    Forced sexual activity: Not on file  Other Topics Concern  . Not on file  Social History Narrative   Lost his mother , lives by himself, sisters check on him    Has 2 sister [name] [phone]), [name] ([phone])----> they are the POA    ADL- cooks his own meals, independent, not driving    Occ  plays the banjo       Allergies as of 04/26/2018   No Known Allergies     Medication List        Accurate as of 04/26/18 11:59 PM. Always use your most recent med list.          aspirin 81 MG tablet Take 81 mg by mouth daily. Reported on 01/19/2016   atorvastatin 20 MG tablet Commonly known as:  LIPITOR Take 1 tablet (20 mg total) by mouth daily.   levothyroxine 88 MCG tablet Commonly known as:  SYNTHROID, LEVOTHROID Take 1 tablet (88 mcg total) by mouth daily before breakfast.   lisinopril-hydrochlorothiazide 10-12.5 MG tablet Commonly known as:  PRINZIDE,ZESTORETIC Take 1 tablet by mouth  daily.          Objective:   Physical Exam BP 126/66 (BP Location: Right Arm, Patient Position: Sitting, Cuff Size: Small)   Pulse (!) 58   Temp 97.7 F (36.5 C) (Oral)   Resp 14   Ht 5\' 7"  (1.702 m)   Wt 169 lb 8 oz (76.9 kg)   SpO2 97%   BMI 26.55 kg/m  General:   Well developed, well nourished . NAD.  HEENT:  Normocephalic . Face symmetric, atraumatic Lungs:  CTA B Normal respiratory effort, no intercostal retractions, no accessory muscle use. Heart: RRR,  no murmur.  No pretibial edema bilaterally  Skin: Not pale. Not jaundice Neurologic:  alert & oriented X3.  Speech normal, gait appropriate for age and unassisted Psych--  Cognition and judgment appear intact.  Cooperative with normal attention span and concentration.  Behavior appropriate. No anxious or depressed appearing.      Assessment & Plan:   Assessment HTN Hyperlipidemia  Hypothyroidism Recurrent UTIs last visit with  urology 2012. Previous eval: CT 2010 bilateral renal cysts; cysto 2009: Unremarkable Learning disability ?Seizure, 2006 >>> saw neurology, EEG negative.not likely SZ  PLAN HTN: Controlled, continue Zestoretic, check a BMP Hypothyroidism: Based on the last TSH we decreased dose of Synthroid, check a tsh Increased PSA, last PSA satisfactory, no symptoms. RTC 09-2018  for a checkup

## 2018-04-26 NOTE — Progress Notes (Signed)
Pre visit review using our clinic review tool, if applicable. No additional management support is needed unless otherwise documented below in the visit note. 

## 2018-04-27 LAB — BASIC METABOLIC PANEL
BUN/Creatinine Ratio: 21 (calc) (ref 6–22)
BUN: 27 mg/dL — ABNORMAL HIGH (ref 7–25)
CO2: 31 mmol/L (ref 20–32)
Calcium: 9.5 mg/dL (ref 8.6–10.3)
Chloride: 101 mmol/L (ref 98–110)
Creat: 1.31 mg/dL (ref 0.70–1.33)
Glucose, Bld: 86 mg/dL (ref 65–99)
Potassium: 3.9 mmol/L (ref 3.5–5.3)
Sodium: 140 mmol/L (ref 135–146)

## 2018-04-27 NOTE — Assessment & Plan Note (Signed)
HTN: Controlled, continue Zestoretic, check a BMP Hypothyroidism: Based on the last TSH we decreased dose of Synthroid, check a tsh Increased PSA, last PSA satisfactory, no symptoms. RTC 09-2018  for a checkup

## 2018-05-03 DIAGNOSIS — H25013 Cortical age-related cataract, bilateral: Secondary | ICD-10-CM | POA: Diagnosis not present

## 2018-05-03 DIAGNOSIS — H40023 Open angle with borderline findings, high risk, bilateral: Secondary | ICD-10-CM | POA: Diagnosis not present

## 2018-05-03 DIAGNOSIS — H2513 Age-related nuclear cataract, bilateral: Secondary | ICD-10-CM | POA: Diagnosis not present

## 2018-05-03 DIAGNOSIS — H40033 Anatomical narrow angle, bilateral: Secondary | ICD-10-CM | POA: Diagnosis not present

## 2018-05-03 DIAGNOSIS — H5203 Hypermetropia, bilateral: Secondary | ICD-10-CM | POA: Diagnosis not present

## 2018-05-12 ENCOUNTER — Other Ambulatory Visit: Payer: Self-pay | Admitting: Internal Medicine

## 2018-05-15 DIAGNOSIS — H40013 Open angle with borderline findings, low risk, bilateral: Secondary | ICD-10-CM | POA: Diagnosis not present

## 2018-05-15 DIAGNOSIS — H40033 Anatomical narrow angle, bilateral: Secondary | ICD-10-CM | POA: Diagnosis not present

## 2018-05-15 DIAGNOSIS — H2513 Age-related nuclear cataract, bilateral: Secondary | ICD-10-CM | POA: Diagnosis not present

## 2018-06-03 ENCOUNTER — Other Ambulatory Visit: Payer: Self-pay | Admitting: Internal Medicine

## 2018-06-04 ENCOUNTER — Other Ambulatory Visit: Payer: Self-pay | Admitting: Internal Medicine

## 2018-10-01 ENCOUNTER — Ambulatory Visit (INDEPENDENT_AMBULATORY_CARE_PROVIDER_SITE_OTHER): Payer: Medicare Other | Admitting: Internal Medicine

## 2018-10-01 ENCOUNTER — Encounter: Payer: Self-pay | Admitting: Internal Medicine

## 2018-10-01 VITALS — BP 126/64 | HR 59 | Temp 98.5°F | Resp 16 | Ht 67.0 in | Wt 175.5 lb

## 2018-10-01 DIAGNOSIS — Z23 Encounter for immunization: Secondary | ICD-10-CM

## 2018-10-01 DIAGNOSIS — I10 Essential (primary) hypertension: Secondary | ICD-10-CM

## 2018-10-01 DIAGNOSIS — E034 Atrophy of thyroid (acquired): Secondary | ICD-10-CM | POA: Diagnosis not present

## 2018-10-01 DIAGNOSIS — E785 Hyperlipidemia, unspecified: Secondary | ICD-10-CM | POA: Diagnosis not present

## 2018-10-01 LAB — LIPID PANEL
Cholesterol: 125 mg/dL (ref 0–200)
HDL: 46.6 mg/dL (ref >39.00)
LDL Cholesterol: 47 mg/dL (ref 0–99)
NonHDL: 78.46
Total CHOL/HDL Ratio: 3
Triglycerides: 156 mg/dL — ABNORMAL HIGH (ref 0.0–149.0)
VLDL: 31.2 mg/dL (ref 0.0–40.0)

## 2018-10-01 LAB — ALT: ALT: 25 U/L (ref 0–53)

## 2018-10-01 LAB — AST: AST: 18 U/L (ref 0–37)

## 2018-10-01 LAB — TSH: TSH: 1.02 u[IU]/mL (ref 0.35–4.50)

## 2018-10-01 NOTE — Patient Instructions (Signed)
GO TO THE LAB : Get the blood work     GO TO THE FRONT DESK Schedule your next appointment for a  Check up in 6-8 months    Check the  blood pressure 2   times a month   Be sure your blood pressure is between 110/65 and  135/85. If it is consistently higher or lower, let me know

## 2018-10-01 NOTE — Progress Notes (Signed)
Subjective:    Patient ID: Dean Dennis, male    DOB: 30-Jun-1956, 62 y.o.   MRN: [id number]  DOS:  10/01/2018 Type of visit - description : f/u, here by himself Interval history: Good medication compliance, he has no concerns.  Review of Systems Denies chest pain, difficulty breathing. No nausea or vomiting basic exam No dysuria, gross hematuria difficulty urinating  Past Medical History:  Diagnosis Date  . HTN (hypertension)   . Hypothyroidism   . Learning disability    evaluated in the past by psych "borderline functioning range of inttelligence  . Recurrent UTI 09/02/2013  . Seizure ([place]) 09/2005   saw neuro, they didnt think he had sz (-) EEG    Past Surgical History:  Procedure Laterality Date  . TONSILLECTOMY    . VASECTOMY  2004    Social History   Socioeconomic History  . Marital status: Single    Spouse name: Not on file  . Number of children: 0  . Years of education: Not on file  . Highest education level: Not on file  Occupational History  . Occupation: disability  Social Needs  . Financial resource strain: Not on file  . Food insecurity:    Worry: Not on file    Inability: Not on file  . Transportation needs:    Medical: Not on file    Non-medical: Not on file  Tobacco Use  . Smoking status: Never Smoker  . Smokeless tobacco: Never Used  Substance and Sexual Activity  . Alcohol use: No  . Drug use: No  . Sexual activity: Not on file  Lifestyle  . Physical activity:    Days per week: Not on file    Minutes per session: Not on file  . Stress: Not on file  Relationships  . Social connections:    Talks on phone: Not on file    Gets together: Not on file    Attends religious service: Not on file    Active member of club or organization: Not on file    Attends meetings of clubs or organizations: Not on file    Relationship status: Not on file  . Intimate partner violence:    Fear of current or ex partner: Not on file    Emotionally  abused: Not on file    Physically abused: Not on file    Forced sexual activity: Not on file  Other Topics Concern  . Not on file  Social History Narrative   Lost his mother , lives by himself, sisters check on him    Has 2 sister [name] [phone]), [name] ([phone])----> they are the POA    ADL- cooks his own meals, independent, not driving    Occ  plays the banjo       Allergies as of 10/01/2018   No Known Allergies     Medication List        Accurate as of 10/01/18  1:52 PM. Always use your most recent med list.          aspirin 81 MG tablet Take 81 mg by mouth daily. Reported on 01/19/2016   atorvastatin 20 MG tablet Commonly known as:  LIPITOR Take 1 tablet (20 mg total) by mouth daily.   levothyroxine 88 MCG tablet Commonly known as:  SYNTHROID, LEVOTHROID Take 1 tablet (88 mcg total) by mouth daily before breakfast.   lisinopril-hydrochlorothiazide 10-12.5 MG tablet Commonly known as:  PRINZIDE,ZESTORETIC TAKE 1 TABLET BY MOUTH ONCE DAILY  Objective:   Physical Exam BP 126/64 (BP Location: Left Arm, Patient Position: Sitting, Cuff Size: Small)   Pulse (!) 59   Temp 98.5 F (36.9 C) (Oral)   Resp 16   Ht 5\' 7"  (1.702 m)   Wt 175 lb 8 oz (79.6 kg)   SpO2 92%   BMI 27.49 kg/m    General:   Well developed, NAD, see BMI.  HEENT:  Normocephalic . Face symmetric, atraumatic Neck: No thyromegaly Lungs:  CTA B Normal respiratory effort, no intercostal retractions, no accessory muscle use. Heart: RRR,  no murmur.  No pretibial edema bilaterally Abdomen soft, nontender. Skin: Not pale. Not jaundice Neurologic:  alert & oriented X3.  Speech normal, gait appropriate for age and unassisted Psych--  Cognition and judgment appear intact.  Cooperative with normal attention span and concentration.  Behavior appropriate. No anxious or depressed appearing.      Assessment & Plan:   Assessment HTN Hyperlipidemia  Hypothyroidism Recurrent  UTIs last visit with urology 2012. Previous eval: CT 2010 bilateral renal cysts; cysto 2009: Unremarkable Learning disability ?Seizure, 2006 >>> saw neurology, EEG negative.not likely SZ  PLAN HTN:On Zestoretic, BP today is very good, reports normal ambulatory BPs but does not have a log.  Last BMP satisfactory. Hypothyroidism: On Synthroid, check a TSH, will need 90-day supplies refill good results. High cholesterol: Not fasting, on Lipitor, will proceed with lipid profile, AST, ALT Preventive care: Td and flu shot today RTC 6 to 8 months.

## 2018-10-01 NOTE — Progress Notes (Signed)
Pre visit review using our clinic review tool, if applicable. No additional management support is needed unless otherwise documented below in the visit note. 

## 2018-10-02 MED ORDER — LEVOTHYROXINE SODIUM 88 MCG PO TABS: 88.0000 ug | ORAL_TABLET | Freq: Every day | ORAL | 1 refills | Status: DC

## 2018-10-02 NOTE — Assessment & Plan Note (Signed)
HTN:On Zestoretic, BP today is very good, reports normal ambulatory BPs but does not have a log.  Last BMP satisfactory. Hypothyroidism: On Synthroid, check a TSH, will need 90-day supplies refill good results. High cholesterol: Not fasting, on Lipitor, will proceed with lipid profile, AST, ALT Preventive care: Td and flu shot today RTC 6 to 8 months.

## 2018-10-02 NOTE — Addendum Note (Signed)
Addended byDamita Dunnings D on: 10/02/2018 04:58 PM   Modules accepted: Orders

## 2018-12-01 ENCOUNTER — Other Ambulatory Visit: Payer: Self-pay | Admitting: Internal Medicine

## 2019-03-01 ENCOUNTER — Other Ambulatory Visit: Payer: Self-pay | Admitting: Internal Medicine

## 2019-05-05 ENCOUNTER — Ambulatory Visit (INDEPENDENT_AMBULATORY_CARE_PROVIDER_SITE_OTHER): Payer: Medicare Other | Admitting: Internal Medicine

## 2019-05-05 ENCOUNTER — Other Ambulatory Visit: Payer: Self-pay

## 2019-05-05 ENCOUNTER — Encounter: Payer: Self-pay | Admitting: Internal Medicine

## 2019-05-05 DIAGNOSIS — E034 Atrophy of thyroid (acquired): Secondary | ICD-10-CM | POA: Diagnosis not present

## 2019-05-05 DIAGNOSIS — I10 Essential (primary) hypertension: Secondary | ICD-10-CM

## 2019-05-05 DIAGNOSIS — E785 Hyperlipidemia, unspecified: Secondary | ICD-10-CM | POA: Diagnosis not present

## 2019-05-05 NOTE — Progress Notes (Signed)
Subjective:    Patient ID: Dean Dennis, male    DOB: 07-11-1956, 63 y.o.   MRN: [id number]  DOS:  05/05/2019 Type of visit - description: Virtual Visit via Video Note  I connected with@ on 05/06/19 at  1:40 PM EDT by a video enabled telemedicine application and verified that I am speaking with the correct person using two identifiers.   THIS ENCOUNTER IS A VIRTUAL VISIT DUE TO COVID-19 - PATIENT WAS NOT SEEN IN THE OFFICE. PATIENT HAS CONSENTED TO VIRTUAL VISIT / TELEMEDICINE VISIT   Location of patient: home  Location of provider: office  I discussed the limitations of evaluation and management by telemedicine and the availability of in person appointments. The patient expressed understanding and agreed to proceed.  History of Present Illness: Routine follow-up In general he feels well. COVID-19: Has been sheltered at home, no symptoms. Good compliance to medication, no ambulatory BPs.     BP Readings from Last 3 Encounters:  10/01/18 126/64  04/26/18 126/66  10/02/17 112/62     Review of Systems Denies fever chills No chest pain no difficulty breathing No cough No dysuria, difficulty urinating or gross hematuria  Past Medical History:  Diagnosis Date  . HTN (hypertension)   . Hypothyroidism   . Learning disability    evaluated in the past by psych "borderline functioning range of inttelligence  . Recurrent UTI 09/02/2013  . Seizure ([place]) 09/2005   saw neuro, they didnt think he had sz (-) EEG    Past Surgical History:  Procedure Laterality Date  . TONSILLECTOMY    . VASECTOMY  2004    Social History   Socioeconomic History  . Marital status: Single    Spouse name: Not on file  . Number of children: 0  . Years of education: Not on file  . Highest education level: Not on file  Occupational History  . Occupation: disability  Social Needs  . Financial resource strain: Not on file  . Food insecurity:    Worry: Not on file    Inability: Not on file   . Transportation needs:    Medical: Not on file    Non-medical: Not on file  Tobacco Use  . Smoking status: Never Smoker  . Smokeless tobacco: Never Used  Substance and Sexual Activity  . Alcohol use: No  . Drug use: No  . Sexual activity: Not on file  Lifestyle  . Physical activity:    Days per week: Not on file    Minutes per session: Not on file  . Stress: Not on file  Relationships  . Social connections:    Talks on phone: Not on file    Gets together: Not on file    Attends religious service: Not on file    Active member of club or organization: Not on file    Attends meetings of clubs or organizations: Not on file    Relationship status: Not on file  . Intimate partner violence:    Fear of current or ex partner: Not on file    Emotionally abused: Not on file    Physically abused: Not on file    Forced sexual activity: Not on file  Other Topics Concern  . Not on file  Social History Narrative   Lost his mother , lives by himself, sisters check on him    Has 2 sister [name] [phone]), [name] ([phone])----> they are the POA    ADL- cooks his own meals, independent,  not driving    Occ  plays the banjo       Allergies as of 05/05/2019   No Known Allergies     Medication List       Accurate as of May 05, 2019 11:59 PM. If you have any questions, ask your nurse or doctor.        aspirin 81 MG tablet Take 81 mg by mouth daily. Reported on 01/19/2016   atorvastatin 20 MG tablet Commonly known as:  LIPITOR Take 1 tablet (20 mg total) by mouth daily.   levothyroxine 88 MCG tablet Commonly known as:  SYNTHROID Take 1 tablet (88 mcg total) by mouth daily before breakfast.   lisinopril-hydrochlorothiazide 10-12.5 MG tablet Commonly known as:  ZESTORETIC Take 1 tablet by mouth daily.           Objective:   Physical Exam There were no vitals taken for this visit. This is a virtual video visit, the patient is alert oriented x3, no apparent distress     Assessment     Assessment HTN Hyperlipidemia  Hypothyroidism Recurrent UTIs last visit with urology 2012. Previous eval: CT 2010 bilateral renal cysts; cysto 2009: Unremarkable Learning disability ?Seizure, 2006 >>> saw neurology, EEG negative.not likely SZ  PLAN Virtual visit HTN: Continue with lisinopril HCT.  No ambulatory BPs, recommend to check his BP once a week.  Will send a message with goals.  Check a BMP and CBC Hyperlipidemia: On Lipitor, well controlled.  Last LFTs satisfactory Hypothyroidism: Reports good center compliance, check a TSH. Plan: Labs this week will schedule RTC CPX 4 to 5 months.   I discussed the assessment and treatment plan with the patient. The patient was provided an opportunity to ask questions and all were answered. The patient agreed with the plan and demonstrated an understanding of the instructions.   The patient was advised to call back or seek an in-person evaluation if the symptoms worsen or if the condition fails to improve as anticipated.

## 2019-05-06 NOTE — Assessment & Plan Note (Signed)
Virtual visit HTN: Continue with lisinopril HCT.  No ambulatory BPs, recommend to check his BP once a week.  Will send a message with goals.  Check a BMP and CBC Hyperlipidemia: On Lipitor, well controlled.  Last LFTs satisfactory Hypothyroidism: Reports good center compliance, check a TSH. Plan: Labs this week will schedule RTC CPX 4 to 5 months.

## 2019-05-07 ENCOUNTER — Other Ambulatory Visit: Payer: Self-pay

## 2019-05-07 ENCOUNTER — Other Ambulatory Visit (INDEPENDENT_AMBULATORY_CARE_PROVIDER_SITE_OTHER): Payer: Medicare Other

## 2019-05-07 DIAGNOSIS — I10 Essential (primary) hypertension: Secondary | ICD-10-CM | POA: Diagnosis not present

## 2019-05-07 DIAGNOSIS — E034 Atrophy of thyroid (acquired): Secondary | ICD-10-CM | POA: Diagnosis not present

## 2019-05-07 LAB — BASIC METABOLIC PANEL
BUN: 19 mg/dL (ref 6–23)
CO2: 32 mEq/L (ref 19–32)
Calcium: 9.6 mg/dL (ref 8.4–10.5)
Chloride: 99 mEq/L (ref 96–112)
Creatinine, Ser: 1.2 mg/dL (ref 0.40–1.50)
GFR: 61.65 mL/min (ref >60.00)
Glucose, Bld: 92 mg/dL (ref 70–99)
Potassium: 4 mEq/L (ref 3.5–5.1)
Sodium: 139 mEq/L (ref 135–145)

## 2019-05-07 LAB — CBC WITH DIFFERENTIAL/PLATELET
Basophils Absolute: 0.2 10*3/uL — ABNORMAL HIGH (ref 0.0–0.1)
Basophils Relative: 2.1 % (ref 0.0–3.0)
Eosinophils Absolute: 0.2 10*3/uL (ref 0.0–0.7)
Eosinophils Relative: 3.1 % (ref 0.0–5.0)
HCT: 41.1 % (ref 39.0–52.0)
Hemoglobin: 14.4 g/dL (ref 13.0–17.0)
Lymphocytes Relative: 24.2 % (ref 12.0–46.0)
Lymphs Abs: 1.7 10*3/uL (ref 0.7–4.0)
MCHC: 35 g/dL (ref 30.0–36.0)
MCV: 99.5 fl (ref 78.0–100.0)
Monocytes Absolute: 0.7 10*3/uL (ref 0.1–1.0)
Monocytes Relative: 9.5 % (ref 3.0–12.0)
Neutro Abs: 4.4 10*3/uL (ref 1.4–7.7)
Neutrophils Relative %: 61.1 % (ref 43.0–77.0)
Platelets: 255 10*3/uL (ref 150.0–400.0)
RBC: 4.13 Mil/uL — ABNORMAL LOW (ref 4.22–5.81)
RDW: 12.9 % (ref 11.5–15.5)
WBC: 7.2 10*3/uL (ref 4.0–10.5)

## 2019-05-07 LAB — TSH: TSH: 2.26 u[IU]/mL (ref 0.35–4.50)

## 2019-05-07 NOTE — Telephone Encounter (Signed)
Pt's sister didn't mention anything about nail beds at virtual visit. Per Dr. Larose Kells schedule face to face visit at their convenience if no symptoms.

## 2019-05-07 NOTE — Telephone Encounter (Signed)
Patient's sister Clarene Critchley) called to inform dr. Larose Kells that his nail beds turn blue/purple.

## 2019-05-08 NOTE — Telephone Encounter (Signed)
Will need a visit, I cannot assess that on a virtual visit.

## 2019-05-21 DIAGNOSIS — H40033 Anatomical narrow angle, bilateral: Secondary | ICD-10-CM | POA: Diagnosis not present

## 2019-05-21 DIAGNOSIS — H40013 Open angle with borderline findings, low risk, bilateral: Secondary | ICD-10-CM | POA: Diagnosis not present

## 2019-05-21 DIAGNOSIS — H2513 Age-related nuclear cataract, bilateral: Secondary | ICD-10-CM | POA: Diagnosis not present

## 2019-05-30 ENCOUNTER — Other Ambulatory Visit: Payer: Self-pay | Admitting: Internal Medicine

## 2019-06-08 ENCOUNTER — Other Ambulatory Visit: Payer: Self-pay | Admitting: Internal Medicine

## 2019-09-09 ENCOUNTER — Other Ambulatory Visit: Payer: Self-pay

## 2019-09-10 ENCOUNTER — Encounter: Payer: Self-pay | Admitting: Internal Medicine

## 2019-09-10 ENCOUNTER — Ambulatory Visit (INDEPENDENT_AMBULATORY_CARE_PROVIDER_SITE_OTHER): Payer: Medicare Other | Admitting: Internal Medicine

## 2019-09-10 VITALS — BP 110/71 | HR 55 | Temp 97.2°F | Resp 16 | Ht 67.0 in | Wt 176.5 lb

## 2019-09-10 DIAGNOSIS — E785 Hyperlipidemia, unspecified: Secondary | ICD-10-CM | POA: Diagnosis not present

## 2019-09-10 DIAGNOSIS — Z0000 Encounter for general adult medical examination without abnormal findings: Secondary | ICD-10-CM

## 2019-09-10 DIAGNOSIS — Z23 Encounter for immunization: Secondary | ICD-10-CM

## 2019-09-10 DIAGNOSIS — E034 Atrophy of thyroid (acquired): Secondary | ICD-10-CM | POA: Diagnosis not present

## 2019-09-10 LAB — PSA: PSA: 2.44 ng/mL (ref 0.10–4.00)

## 2019-09-10 LAB — LIPID PANEL
Cholesterol: 123 mg/dL (ref 0–200)
HDL: 45.7 mg/dL (ref >39.00)
LDL Cholesterol: 50 mg/dL (ref 0–99)
NonHDL: 77.19
Total CHOL/HDL Ratio: 3
Triglycerides: 137 mg/dL (ref 0.0–149.0)
VLDL: 27.4 mg/dL (ref 0.0–40.0)

## 2019-09-10 LAB — AST: AST: 21 U/L (ref 0–37)

## 2019-09-10 LAB — TSH: TSH: 0.95 u[IU]/mL (ref 0.35–4.50)

## 2019-09-10 LAB — ALT: ALT: 37 U/L (ref 0–53)

## 2019-09-10 NOTE — Progress Notes (Signed)
Pre visit review using our clinic review tool, if applicable. No additional management support is needed unless otherwise documented below in the visit note. 

## 2019-09-10 NOTE — Progress Notes (Signed)
Subjective:    Patient ID: Dean Dennis, male    DOB: [birth year], 63 y.o.   MRN: [id number]  DOS:  09/10/2019 Type of visit - description: CPX Here for CPX Reports he is doing well. He remains active, rides his bicycle around town  Review of Systems  A 14 point review of systems is negative    Past Medical History:  Diagnosis Date  . HTN (hypertension)   . Hypothyroidism   . Learning disability    evaluated in the past by psych "borderline functioning range of inttelligence  . Recurrent UTI 09/02/2013  . Seizure ([place]) 09/2005   saw neuro, they didnt think he had sz (-) EEG    Past Surgical History:  Procedure Laterality Date  . TONSILLECTOMY    . VASECTOMY  2004    Social History   Socioeconomic History  . Marital status: Single    Spouse name: Not on file  . Number of children: 0  . Years of education: Not on file  . Highest education level: Not on file  Occupational History  . Occupation: disability  Social Needs  . Financial resource strain: Not on file  . Food insecurity    Worry: Not on file    Inability: Not on file  . Transportation needs    Medical: Not on file    Non-medical: Not on file  Tobacco Use  . Smoking status: Never Smoker  . Smokeless tobacco: Never Used  Substance and Sexual Activity  . Alcohol use: No  . Drug use: No  . Sexual activity: Not on file  Lifestyle  . Physical activity    Days per week: Not on file    Minutes per session: Not on file  . Stress: Not on file  Relationships  . Social Herbalist on phone: Not on file    Gets together: Not on file    Attends religious service: Not on file    Active member of club or organization: Not on file    Attends meetings of clubs or organizations: Not on file    Relationship status: Not on file  . Intimate partner violence    Fear of current or ex partner: Not on file    Emotionally abused: Not on file    Physically abused: Not on file    Forced sexual activity:  Not on file  Other Topics Concern  . Not on file  Social History Narrative   Lost his mother , lives by himself, sisters check on him    Has 2 sister Dean Dennis [phone]), Dean Dennis ([phone])----> they are the POA    ADL- cooks his own meals, independent, not driving    Occ  plays the banjo      Family History  Problem Relation Age of Onset  . Heart attack Father 21  . Lung cancer Mother        remote smoker  . Prostate cancer Neg Hx   . Colon cancer Neg Hx   . Diabetes Neg Hx      Allergies as of 09/10/2019   No Known Allergies     Medication List       Accurate as of September 10, 2019 11:59 PM. If you have any questions, ask your nurse or doctor.        aspirin 81 MG tablet Take 81 mg by mouth daily. Reported on 01/19/2016   atorvastatin 20 MG tablet Commonly known as: LIPITOR Take 1 tablet (  20 mg total) by mouth daily.   levothyroxine 88 MCG tablet Commonly known as: SYNTHROID Take 1 tablet (88 mcg total) by mouth daily before breakfast.   lisinopril-hydrochlorothiazide 10-12.5 MG tablet Commonly known as: ZESTORETIC Take 1 tablet by mouth daily.           Objective:   Physical Exam BP 110/71 (BP Location: Left Arm, Patient Position: Sitting, Cuff Size: Small)   Pulse (!) 55   Temp (!) 97.2 F (36.2 C) (Temporal)   Resp 16   Ht 5\' 7"  (1.702 m)   Wt 176 lb 8 oz (80.1 kg)   SpO2 100%   BMI 27.64 kg/m   General: Well developed, NAD, BMI noted Neck: No  thyromegaly  HEENT:  Normocephalic . Face symmetric, atraumatic Lungs:  CTA B Normal respiratory effort, no intercostal retractions, no accessory muscle use. Heart: RRR,  no murmur.  No pretibial edema bilaterally  Abdomen:  Not distended, soft, non-tender. No rebound or rigidity. DRE: Normal sphincter tone, prostate is a small, nontender.  No stools found Skin: Exposed areas without rash. Not pale. Not jaundice Neurologic:  alert & oriented X3.  Speech normal, gait appropriate for age and  unassisted Strength symmetric and appropriate for age.  Psych:   Behavior appropriate. No anxious or depressed appearing.     Assessment     Assessment HTN Hyperlipidemia  Hypothyroidism Recurrent UTIs last visit with urology 2012. Previous eval: CT 2010 bilateral renal cysts; cysto 2009: Unremarkable Learning disability ?Seizure, 2006 >>> saw neurology, EEG negative.not likely SZ  PLAN HTN: Last BMP satisfactory, continueZestoretic. Hyperlipidemia: Continue Lipitor, checking labs Hypothyroidism: Continue Synthroid, check labs Recurrent UTIs: Asymptomatic Social: Has a learning disability, lives by himself, his family check on him frequently. RTC 6 to 8 months

## 2019-09-10 NOTE — Patient Instructions (Addendum)
Please schedule Medicare Wellness with Glenard Haring.   GO TO THE LAB : Get the blood work     GO TO THE FRONT DESK Schedule your next appointment   for checkup in 6 to 8 months    Check the  blood pressure every month BP GOAL is between 110/65 and  135/85. If it is consistently higher or lower, let me know   Use your helmet and be careful when you ride your bike

## 2019-09-10 NOTE — Assessment & Plan Note (Addendum)
-  Td 09, 2019 - PNM shot 2016  prevnar: 09-2016 -zostavax  - 2014   -Flu shot today -Prostate cancer screening: DRE today normal, no sxs.  Check a PSA. -[facility]: colonoscopy 11-2013,  Per letter 10 years -diet-exercise: Reportedly doing well, he rides his bike, strongly encouraged to use a helmet and be careful -Labs : AST, ALT, FLP, PSA, TSH Continue with ASA due to [place] of CAD

## 2019-09-11 NOTE — Assessment & Plan Note (Signed)
HTN: Last BMP satisfactory, continueZestoretic. Hyperlipidemia: Continue Lipitor, checking labs Hypothyroidism: Continue Synthroid, check labs Recurrent UTIs: Asymptomatic Social: Has a learning disability, lives by himself, his family check on him frequently. RTC 6 to 8 months

## 2019-11-26 ENCOUNTER — Other Ambulatory Visit: Payer: Self-pay | Admitting: Internal Medicine

## 2020-02-24 ENCOUNTER — Other Ambulatory Visit: Payer: Self-pay | Admitting: Internal Medicine

## 2020-03-26 ENCOUNTER — Ambulatory Visit: Payer: Medicare Other | Attending: Internal Medicine

## 2020-03-26 DIAGNOSIS — Z23 Encounter for immunization: Secondary | ICD-10-CM

## 2020-03-26 NOTE — Progress Notes (Signed)
   Covid-19 Vaccination Clinic  Name:  Dean Dennis    MRN: [id number] DOB: 06-28-1956  03/26/2020  Mr. Dean Dennis was observed post Covid-19 immunization for 15 minutes without incident. He was provided with Vaccine Information Sheet and instruction to access the V-Safe system.   Mr. Dean Dennis was instructed to call 911 with any severe reactions post vaccine: Marland Kitchen Difficulty breathing  . Swelling of face and throat  . A fast heartbeat  . A bad rash all over body  . Dizziness and weakness   Immunizations Administered    Name Date Dose VIS Date Route   Pfizer COVID-19 Vaccine 03/26/2020  8:28 AM 0.3 mL 12/05/2019 Intramuscular   Manufacturer: [employer], [employer]   Lot: [id number]   [place]: [id number]

## 2020-03-27 ENCOUNTER — Ambulatory Visit: Payer: Medicare Other

## 2020-04-09 ENCOUNTER — Ambulatory Visit: Payer: Medicare Other | Admitting: Internal Medicine

## 2020-04-16 ENCOUNTER — Ambulatory Visit: Payer: Medicare Other | Admitting: Internal Medicine

## 2020-04-16 ENCOUNTER — Ambulatory Visit: Payer: Medicare Other | Attending: Internal Medicine

## 2020-04-16 DIAGNOSIS — Z23 Encounter for immunization: Secondary | ICD-10-CM

## 2020-04-16 NOTE — Progress Notes (Signed)
   Covid-19 Vaccination Clinic  Name:  Dean Dennis    MRN: [id number] DOB: [birth year]  04/16/2020  Mr. Dean Dennis was observed post Covid-19 immunization for 15 minutes without incident. He was provided with Vaccine Information Sheet and instruction to access the V-Safe system.   Mr. Dean Dennis was instructed to call 911 with any severe reactions post vaccine: Marland Kitchen Difficulty breathing  . Swelling of face and throat  . A fast heartbeat  . A bad rash all over body  . Dizziness and weakness   Immunizations Administered    Name Date Dose VIS Date Route   Pfizer COVID-19 Vaccine 04/16/2020  9:29 AM 0.3 mL 02/18/2019 Intramuscular   Manufacturer: [place]   Lot: [id number]   [place]: [id number]

## 2020-04-16 NOTE — Progress Notes (Signed)
   Covid-19 Vaccination Clinic  Name:  Dean Dennis    MRN: [id number] DOB: April 14, 1956  04/16/2020  Dean Dennis was observed post Covid-19 immunization for 15 minutes without incident. He was provided with Vaccine Information Sheet and instruction to access the V-Safe system.   Dean Dennis was instructed to call 911 with any severe reactions post vaccine: Marland Kitchen Difficulty breathing  . Swelling of face and throat  . A fast heartbeat  . A bad rash all over body  . Dizziness and weakness   Immunizations Administered    Name Date Dose VIS Date Route   Pfizer COVID-19 Vaccine 04/16/2020  9:29 AM 0.3 mL 02/18/2019 Intramuscular   Manufacturer: [place]   Lot: [id number]   [place]: [id number]

## 2020-04-21 ENCOUNTER — Ambulatory Visit: Payer: Medicare Other

## 2020-04-21 ENCOUNTER — Ambulatory Visit: Payer: Medicare Other | Admitting: Internal Medicine

## 2020-04-26 ENCOUNTER — Other Ambulatory Visit: Payer: Self-pay

## 2020-04-26 ENCOUNTER — Encounter: Payer: Self-pay | Admitting: Internal Medicine

## 2020-04-26 ENCOUNTER — Ambulatory Visit (INDEPENDENT_AMBULATORY_CARE_PROVIDER_SITE_OTHER): Payer: Medicare Other | Admitting: Internal Medicine

## 2020-04-26 VITALS — BP 121/68 | HR 66 | Temp 97.8°F | Resp 18 | Ht 63.0 in | Wt 167.4 lb

## 2020-04-26 DIAGNOSIS — E785 Hyperlipidemia, unspecified: Secondary | ICD-10-CM

## 2020-04-26 DIAGNOSIS — I10 Essential (primary) hypertension: Secondary | ICD-10-CM | POA: Diagnosis not present

## 2020-04-26 DIAGNOSIS — E034 Atrophy of thyroid (acquired): Secondary | ICD-10-CM | POA: Diagnosis not present

## 2020-04-26 LAB — CBC WITH DIFFERENTIAL/PLATELET
Basophils Absolute: 0.1 10*3/uL (ref 0.0–0.1)
Basophils Relative: 0.9 % (ref 0.0–3.0)
Eosinophils Absolute: 0.1 10*3/uL (ref 0.0–0.7)
Eosinophils Relative: 1.6 % (ref 0.0–5.0)
HCT: 39.1 % (ref 39.0–52.0)
Hemoglobin: 13.7 g/dL (ref 13.0–17.0)
Lymphocytes Relative: 19.2 % (ref 12.0–46.0)
Lymphs Abs: 1.5 10*3/uL (ref 0.7–4.0)
MCHC: 34.9 g/dL (ref 30.0–36.0)
MCV: 100.1 fl — ABNORMAL HIGH (ref 78.0–100.0)
Monocytes Absolute: 0.7 10*3/uL (ref 0.1–1.0)
Monocytes Relative: 9.8 % (ref 3.0–12.0)
Neutro Abs: 5.2 10*3/uL (ref 1.4–7.7)
Neutrophils Relative %: 68.5 % (ref 43.0–77.0)
Platelets: 229 10*3/uL (ref 150.0–400.0)
RBC: 3.91 Mil/uL — ABNORMAL LOW (ref 4.22–5.81)
RDW: 13 % (ref 11.5–15.5)
WBC: 7.6 10*3/uL (ref 4.0–10.5)

## 2020-04-26 LAB — BASIC METABOLIC PANEL
BUN: 21 mg/dL (ref 6–23)
CO2: 30 mEq/L (ref 19–32)
Calcium: 9.1 mg/dL (ref 8.4–10.5)
Chloride: 100 mEq/L (ref 96–112)
Creatinine, Ser: 1.25 mg/dL (ref 0.40–1.50)
GFR: 58.63 mL/min — ABNORMAL LOW (ref >60.00)
Glucose, Bld: 108 mg/dL — ABNORMAL HIGH (ref 70–99)
Potassium: 3.8 mEq/L (ref 3.5–5.1)
Sodium: 138 mEq/L (ref 135–145)

## 2020-04-26 LAB — TSH: TSH: 0.21 u[IU]/mL — ABNORMAL LOW (ref 0.35–4.50)

## 2020-04-26 NOTE — Progress Notes (Signed)
   Subjective:    Patient ID: Dean Dennis, male    DOB: [birth year], 64 y.o.   MRN: [id number]  DOS:  04/26/2020 Type of visit - description: Routine checkup No concerns. We reviewed together his medications. Labs reviewed.     Review of Systems Doing well emotionally. Sees his sister regularly  Past Medical History:  Diagnosis Date  . HTN (hypertension)   . Hypothyroidism   . Learning disability    evaluated in the past by psych "borderline functioning range of inttelligence  . Recurrent UTI 09/02/2013  . Seizure ([place]) 09/2005   saw neuro, they didnt think he had sz (-) EEG    Past Surgical History:  Procedure Laterality Date  . TONSILLECTOMY    . VASECTOMY  2004    Allergies as of 04/26/2020   No Known Allergies     Medication List       Accurate as of Apr 26, 2020 10:28 AM. If you have any questions, ask your nurse or doctor.        aspirin 81 MG tablet Take 81 mg by mouth daily. Reported on 01/19/2016   atorvastatin 20 MG tablet Commonly known as: LIPITOR Take 1 tablet (20 mg total) by mouth daily.   levothyroxine 88 MCG tablet Commonly known as: SYNTHROID Take 1 tablet (88 mcg total) by mouth daily before breakfast.   lisinopril-hydrochlorothiazide 10-12.5 MG tablet Commonly known as: ZESTORETIC Take 1 tablet by mouth daily.          Objective:   Physical Exam BP 121/68 (BP Location: Left Arm, Patient Position: Sitting, Cuff Size: Small)   Pulse 66   Temp 97.8 F (36.6 C) (Temporal)   Resp 18   Ht 5\' 3"  (1.6 m)   Wt 167 lb 6 oz (75.9 kg)   SpO2 100%   BMI 29.65 kg/m  General:   Well developed, NAD, BMI noted. HEENT:  Normocephalic . Face symmetric, atraumatic Lungs:  CTA B Normal respiratory effort, no intercostal retractions, no accessory muscle use. Heart: RRR,  no murmur.  Lower extremities: no pretibial edema bilaterally  Skin: Not pale. Not jaundice Neurologic, Psych--  At baseline.  Behavior appropriate. No anxious or  depressed appearing.      Assessment     Assessment HTN Hyperlipidemia  Hypothyroidism Recurrent UTIs last visit with urology 2012. Previous eval: CT 2010 bilateral renal cysts; cysto 2009: Unremarkable Learning disability ?Seizure, 2006 >>> saw neurology, EEG negative.not likely SZ  PLAN HTN: BP today is very good, on Zestoretic, check a BMP and CBC.  Monitor BPs at home recommended High cholesterol: On Lipitor, last FLP satisfactory.  No change Hypothyroidism: On Synthroid, check TSH COVID-19: Had 2 shots RTC 6 months CPX    This visit occurred during the SARS-CoV-2 public health emergency.  Safety protocols were in place, including screening questions prior to the visit, additional usage of staff PPE, and extensive cleaning of exam room while observing appropriate contact time as indicated for disinfecting solutions.

## 2020-04-26 NOTE — Assessment & Plan Note (Signed)
HTN: BP today is very good, on Zestoretic, check a BMP and CBC.  Monitor BPs at home recommended High cholesterol: On Lipitor, last FLP satisfactory.  No change Hypothyroidism: On Synthroid, check TSH COVID-19: Had 2 shots RTC 6 months CPX

## 2020-04-26 NOTE — Patient Instructions (Addendum)
Please schedule Medicare Wellness with Glenard Haring.    Check the  blood pressure every month BP GOAL is between 110/65 and  135/85. If it is consistently higher or lower, let me know  GO TO THE LAB : Get the blood work     [place], [place] back for   a physical exam in 6 months

## 2020-04-26 NOTE — Progress Notes (Signed)
Pre visit review using our clinic review tool, if applicable. No additional management support is needed unless otherwise documented below in the visit note. 

## 2020-04-27 MED ORDER — LEVOTHYROXINE SODIUM 75 MCG PO TABS: 75.0000 ug | ORAL_TABLET | Freq: Every day | ORAL | 2 refills | Status: DC

## 2020-04-27 NOTE — Addendum Note (Signed)
Addended byDamita Dunnings D on: 04/27/2020 04:14 PM   Modules accepted: Orders

## 2020-05-24 ENCOUNTER — Other Ambulatory Visit: Payer: Self-pay | Admitting: Internal Medicine

## 2020-06-14 ENCOUNTER — Telehealth: Payer: Self-pay

## 2020-06-14 NOTE — Telephone Encounter (Signed)
Received [employer] for Pt- requesting exemption. Form placed in PCP red folder.

## 2020-06-15 NOTE — Telephone Encounter (Signed)
Please print a letter stating that in my medical opinion he is unable to serve jury duty due to a mental condition (learning disability).

## 2020-06-15 NOTE — Telephone Encounter (Signed)
LMOM for Dean Dennis's sister informing that jury exemption letter is ready for pick up at front desk. They have to take this directly to the magistrates office in [place].

## 2020-07-18 ENCOUNTER — Telehealth: Payer: Self-pay | Admitting: Internal Medicine

## 2020-07-18 NOTE — Telephone Encounter (Signed)
he is due for a TSH, please arrange for a lab appointment

## 2020-08-06 NOTE — Telephone Encounter (Signed)
I have been calling patient at [phone], I left several voice mail 07/20/21 at 3:39pm, 07/29/21at 04:29 pm & 08/06/20 at 10:43 am.

## 2020-08-22 ENCOUNTER — Other Ambulatory Visit: Payer: Self-pay | Admitting: Internal Medicine

## 2020-08-23 ENCOUNTER — Other Ambulatory Visit: Payer: Self-pay | Admitting: Internal Medicine

## 2020-08-23 MED ORDER — LEVOTHYROXINE SODIUM 75 MCG PO TABS: 75.0000 ug | ORAL_TABLET | Freq: Every day | ORAL | 0 refills | Status: DC

## 2020-09-26 ENCOUNTER — Other Ambulatory Visit: Payer: Self-pay | Admitting: Internal Medicine

## 2020-09-27 ENCOUNTER — Other Ambulatory Visit: Payer: Self-pay | Admitting: Internal Medicine

## 2020-10-27 ENCOUNTER — Encounter: Payer: Self-pay | Admitting: Internal Medicine

## 2020-10-27 ENCOUNTER — Ambulatory Visit (INDEPENDENT_AMBULATORY_CARE_PROVIDER_SITE_OTHER): Payer: Medicare Other | Admitting: Internal Medicine

## 2020-10-27 ENCOUNTER — Other Ambulatory Visit: Payer: Self-pay

## 2020-10-27 VITALS — BP 122/76 | HR 58 | Temp 97.8°F | Resp 16 | Ht 63.0 in | Wt 173.5 lb

## 2020-10-27 DIAGNOSIS — R739 Hyperglycemia, unspecified: Secondary | ICD-10-CM | POA: Diagnosis not present

## 2020-10-27 DIAGNOSIS — E785 Hyperlipidemia, unspecified: Secondary | ICD-10-CM

## 2020-10-27 DIAGNOSIS — E034 Atrophy of thyroid (acquired): Secondary | ICD-10-CM

## 2020-10-27 DIAGNOSIS — Z23 Encounter for immunization: Secondary | ICD-10-CM

## 2020-10-27 DIAGNOSIS — Z0000 Encounter for general adult medical examination without abnormal findings: Secondary | ICD-10-CM

## 2020-10-27 NOTE — Progress Notes (Signed)
   Subjective:    Patient ID: Dean Dennis, male    DOB: 05-May-1956, 64 y.o.   MRN: [id number]  DOS:  10/27/2020 Type of visit - description: CPX Since the last office visit he reports he is doing well. Has no major concerns. He specifically denies LUTS.   Review of Systems  Other than above, a 14 point review of systems is negative     Past Medical History:  Diagnosis Date  . HTN (hypertension)   . Hypothyroidism   . Learning disability    evaluated in the past by psych "borderline functioning range of inttelligence  . Recurrent UTI 09/02/2013  . Seizure ([place]) 09/2005   saw neuro, they didnt think he had sz (-) EEG    Past Surgical History:  Procedure Laterality Date  . TONSILLECTOMY    . VASECTOMY  2004    Allergies as of 10/27/2020   No Known Allergies     Medication List       Accurate as of October 27, 2020 11:59 PM. If you have any questions, ask your nurse or doctor.        aspirin 81 MG tablet Take 81 mg by mouth daily. Reported on 01/19/2016   atorvastatin 20 MG tablet Commonly known as: LIPITOR Take 1 tablet (20 mg total) by mouth daily.   levothyroxine 75 MCG tablet Commonly known as: SYNTHROID Take 1 tablet (75 mcg total) by mouth daily before breakfast.   lisinopril-hydrochlorothiazide 10-12.5 MG tablet Commonly known as: ZESTORETIC Take 1 tablet by mouth daily.          Objective:   Physical Exam BP 122/76 (BP Location: Right Arm, Patient Position: Sitting, Cuff Size: Small)   Pulse (!) 58   Temp 97.8 F (36.6 C) (Oral)   Resp 16   Ht 5\' 3"  (1.6 m)   Wt 173 lb 8 oz (78.7 kg)   SpO2 100%   BMI 30.73 kg/m  General: Well developed, NAD, BMI noted Neck: No  thyromegaly  HEENT:  Normocephalic . Face symmetric, atraumatic Lungs:  CTA B Normal respiratory effort, no intercostal retractions, no accessory muscle use. Heart: RRR,  no murmur.  Abdomen:  Not distended, soft, non-tender. No rebound or rigidity.   Lower extremities:  no pretibial edema bilaterally  Skin: Exposed areas without rash. Not pale. Not jaundice Neurologic:  alert & oriented X3.  Gait appropriate for age and unassisted Strength symmetric and appropriate for age.  Psych: He has a learning disability, seems at baseline. Cooperative with normal attention span and concentration.  Behavior appropriate. No anxious or depressed appearing.     Assessment      Assessment HTN Hyperlipidemia  Hypothyroidism Recurrent UTIs last visit with urology 2012. Previous eval: CT 2010 bilateral renal cysts; cysto 2009: Unremarkable Learning disability ?Seizure, 2006 >>> saw neurology, EEG negative.not likely SZ  PLAN Here for CPX HTN: On Zestoretic, BP today is very good, reports that when he checks at home BPs are okay but could not give me any details.  Checking labs Hyperlipidemia: On Lipitor, checking labs Hypothyroidism: Based on last TSH, Synthroid decreased to 75 mcg, checking labs Recurrent UTIs: No symptoms RTC 1 year    This visit occurred during the SARS-CoV-2 public health emergency.  Safety protocols were in place, including screening questions prior to the visit, additional usage of staff PPE, and extensive cleaning of exam room while observing appropriate contact time as indicated for disinfecting solutions.

## 2020-10-27 NOTE — Patient Instructions (Addendum)
Happy early Dean Dennis!  Check the  blood pressure once a month BP GOAL is between 110/65 and  135/85. If it is consistently higher or lower, let me know  Please consider taking your Covid vaccination #3 (booster)  GO TO THE LAB : Get the blood work     [place], [place] back for   a physical exam in 1 year, call me sooner if needed

## 2020-10-27 NOTE — Progress Notes (Signed)
Pre visit review using our clinic review tool, if applicable. No additional management support is needed unless otherwise documented below in the visit note. 

## 2020-10-28 ENCOUNTER — Encounter: Payer: Self-pay | Admitting: Internal Medicine

## 2020-10-28 LAB — COMPREHENSIVE METABOLIC PANEL
AG Ratio: 2 (calc) (ref 1.0–2.5)
ALT: 30 U/L (ref 9–46)
AST: 23 U/L (ref 10–35)
Albumin: 4.7 g/dL (ref 3.6–5.1)
Alkaline phosphatase (APISO): 72 U/L (ref 35–144)
BUN/Creatinine Ratio: 18 (calc) (ref 6–22)
BUN: 26 mg/dL — ABNORMAL HIGH (ref 7–25)
CO2: 29 mmol/L (ref 20–32)
Calcium: 9.8 mg/dL (ref 8.6–10.3)
Chloride: 101 mmol/L (ref 98–110)
Creat: 1.46 mg/dL — ABNORMAL HIGH (ref 0.70–1.25)
Globulin: 2.4 g/dL (calc) (ref 1.9–3.7)
Glucose, Bld: 77 mg/dL (ref 65–99)
Potassium: 4 mmol/L (ref 3.5–5.3)
Sodium: 139 mmol/L (ref 135–146)
Total Bilirubin: 0.8 mg/dL (ref 0.2–1.2)
Total Protein: 7.1 g/dL (ref 6.1–8.1)

## 2020-10-28 LAB — HEMOGLOBIN A1C
Hgb A1c MFr Bld: 5.4 % of total Hgb (ref <5.7)
Mean Plasma Glucose: 108 (calc)
eAG (mmol/L): 6 (calc)

## 2020-10-28 LAB — TSH: TSH: 4.08 mIU/L (ref 0.40–4.50)

## 2020-10-28 LAB — LIPID PANEL
Cholesterol: 132 mg/dL (ref <200)
HDL: 45 mg/dL (ref >=40)
LDL Cholesterol (Calc): 73 mg/dL (calc)
Non-HDL Cholesterol (Calc): 87 mg/dL (calc) (ref <130)
Total CHOL/HDL Ratio: 2.9 (calc) (ref <5.0)
Triglycerides: 60 mg/dL (ref <150)

## 2020-10-28 NOTE — Assessment & Plan Note (Signed)
-   Td  2019 - PNM shot 2016;  prevnar: 09-2016 -zostavax  - 2014   - had covid vax x2, recommend a booster, he is somewhat hesitant -Flu shot today -Prostate cancer screening: DRE and psa wnl 2020. -CCS: colonoscopy 11-2013,  Per letter 10 years -diet-exercise: - labs: CMP, FLP, TSH, A1c Continue with ASA due to [place] of CAD

## 2020-10-28 NOTE — Assessment & Plan Note (Signed)
Here for CPX HTN: On Zestoretic, BP today is very good, reports that when he checks at home BPs are okay but could not give me any details.  Checking labs Hyperlipidemia: On Lipitor, checking labs Hypothyroidism: Based on last TSH, Synthroid decreased to 75 mcg, checking labs Recurrent UTIs: No symptoms RTC 1 year

## 2020-11-01 MED ORDER — LEVOTHYROXINE SODIUM 75 MCG PO TABS
75.0000 ug | ORAL_TABLET | Freq: Every day | ORAL | 1 refills | Status: DC
Start: 2020-11-01 — End: 2021-05-20

## 2020-11-01 MED ORDER — ATORVASTATIN CALCIUM 20 MG PO TABS
20.0000 mg | ORAL_TABLET | Freq: Every day | ORAL | 1 refills | Status: DC
Start: 2020-11-01 — End: 2021-05-18

## 2020-11-01 NOTE — Addendum Note (Signed)
Addended byDamita Dunnings D on: 11/01/2020 08:13 AM   Modules accepted: Orders

## 2020-11-22 ENCOUNTER — Other Ambulatory Visit: Payer: Self-pay | Admitting: Internal Medicine

## 2021-02-19 ENCOUNTER — Other Ambulatory Visit: Payer: Self-pay | Admitting: Internal Medicine

## 2021-02-22 ENCOUNTER — Other Ambulatory Visit: Payer: Self-pay | Admitting: Internal Medicine

## 2021-04-14 ENCOUNTER — Encounter: Payer: Self-pay | Admitting: Internal Medicine

## 2021-05-17 ENCOUNTER — Other Ambulatory Visit: Payer: Medicare Other

## 2021-05-18 ENCOUNTER — Other Ambulatory Visit: Payer: Self-pay | Admitting: Internal Medicine

## 2021-05-19 ENCOUNTER — Other Ambulatory Visit (INDEPENDENT_AMBULATORY_CARE_PROVIDER_SITE_OTHER): Payer: Medicare Other

## 2021-05-19 ENCOUNTER — Other Ambulatory Visit: Payer: Self-pay

## 2021-05-19 DIAGNOSIS — E034 Atrophy of thyroid (acquired): Secondary | ICD-10-CM | POA: Diagnosis not present

## 2021-05-19 LAB — TSH: TSH: 3.34 u[IU]/mL (ref 0.35–4.50)

## 2021-05-20 ENCOUNTER — Other Ambulatory Visit: Payer: Self-pay | Admitting: *Deleted

## 2021-05-20 MED ORDER — LEVOTHYROXINE SODIUM 75 MCG PO TABS
75.0000 ug | ORAL_TABLET | Freq: Every day | ORAL | 1 refills | Status: DC
Start: 2021-05-20 — End: 2021-11-14

## 2021-08-16 ENCOUNTER — Other Ambulatory Visit: Payer: Self-pay | Admitting: Internal Medicine

## 2021-09-16 ENCOUNTER — Telehealth: Payer: Self-pay | Admitting: Internal Medicine

## 2021-09-16 NOTE — Telephone Encounter (Signed)
Left message for patient to call back and schedule Medicare Annual Wellness Visit (AWV) in office.   If not able to come in office, please offer to do virtually or by telephone.  Left office number and my jabber [phone].  Last AWV:03/28/2017  Please schedule at anytime with Nurse Health Advisor.

## 2021-10-28 ENCOUNTER — Encounter: Payer: Medicare Other | Admitting: Internal Medicine

## 2021-11-10 ENCOUNTER — Other Ambulatory Visit: Payer: Self-pay

## 2021-11-10 ENCOUNTER — Ambulatory Visit (INDEPENDENT_AMBULATORY_CARE_PROVIDER_SITE_OTHER): Payer: Medicare Other | Admitting: Internal Medicine

## 2021-11-10 ENCOUNTER — Encounter: Payer: Self-pay | Admitting: Internal Medicine

## 2021-11-10 VITALS — BP 122/82 | HR 72 | Temp 97.7°F | Resp 16 | Ht 63.0 in | Wt 160.0 lb

## 2021-11-10 DIAGNOSIS — Z0000 Encounter for general adult medical examination without abnormal findings: Secondary | ICD-10-CM | POA: Diagnosis not present

## 2021-11-10 DIAGNOSIS — E034 Atrophy of thyroid (acquired): Secondary | ICD-10-CM

## 2021-11-10 LAB — LIPID PANEL
Cholesterol: 135 mg/dL (ref 0–200)
HDL: 57.5 mg/dL (ref >39.00)
LDL Cholesterol: 62 mg/dL (ref 0–99)
NonHDL: 77.11
Total CHOL/HDL Ratio: 2
Triglycerides: 77 mg/dL (ref 0.0–149.0)
VLDL: 15.4 mg/dL (ref 0.0–40.0)

## 2021-11-10 LAB — CBC WITH DIFFERENTIAL/PLATELET
Basophils Absolute: 0.1 10*3/uL (ref 0.0–0.1)
Basophils Relative: 2.3 % (ref 0.0–3.0)
Eosinophils Absolute: 0.3 10*3/uL (ref 0.0–0.7)
Eosinophils Relative: 4.3 % (ref 0.0–5.0)
HCT: 43.3 % (ref 39.0–52.0)
Hemoglobin: 14.9 g/dL (ref 13.0–17.0)
Lymphocytes Relative: 23.6 % (ref 12.0–46.0)
Lymphs Abs: 1.5 10*3/uL (ref 0.7–4.0)
MCHC: 34.3 g/dL (ref 30.0–36.0)
MCV: 101.1 fl — ABNORMAL HIGH (ref 78.0–100.0)
Monocytes Absolute: 0.6 10*3/uL (ref 0.1–1.0)
Monocytes Relative: 10.3 % (ref 3.0–12.0)
Neutro Abs: 3.7 10*3/uL (ref 1.4–7.7)
Neutrophils Relative %: 59.5 % (ref 43.0–77.0)
Platelets: 220 10*3/uL (ref 150.0–400.0)
RBC: 4.29 Mil/uL (ref 4.22–5.81)
RDW: 12.8 % (ref 11.5–15.5)
WBC: 6.2 10*3/uL (ref 4.0–10.5)

## 2021-11-10 LAB — COMPREHENSIVE METABOLIC PANEL
ALT: 25 U/L (ref 0–53)
AST: 19 U/L (ref 0–37)
Albumin: 5 g/dL (ref 3.5–5.2)
Alkaline Phosphatase: 92 U/L (ref 39–117)
BUN: 23 mg/dL (ref 6–23)
CO2: 34 mEq/L — ABNORMAL HIGH (ref 19–32)
Calcium: 9.8 mg/dL (ref 8.4–10.5)
Chloride: 95 mEq/L — ABNORMAL LOW (ref 96–112)
Creatinine, Ser: 1.3 mg/dL (ref 0.40–1.50)
GFR: 58.66 mL/min — ABNORMAL LOW (ref >60.00)
Glucose, Bld: 80 mg/dL (ref 70–99)
Potassium: 4.2 mEq/L (ref 3.5–5.1)
Sodium: 136 mEq/L (ref 135–145)
Total Bilirubin: 0.8 mg/dL (ref 0.2–1.2)
Total Protein: 7.5 g/dL (ref 6.0–8.3)

## 2021-11-10 LAB — TSH: TSH: 2.5 u[IU]/mL (ref 0.35–5.50)

## 2021-11-10 LAB — PSA: PSA: 3.07 ng/mL (ref 0.10–4.00)

## 2021-11-10 NOTE — Patient Instructions (Signed)
Vaccines are recommended: Shingrix COVID booster  Check the  blood pressure  weekly   BP GOAL is between 110/65 and  135/85. If it is consistently higher or lower, let me know      GO TO THE LAB : Get the blood work     [place], [place] back for   a checkup in 6 months

## 2021-11-10 NOTE — Progress Notes (Signed)
Subjective:    Patient ID: Dean Dennis, male    DOB: Nov 14, 1956, 65 y.o.   MRN: [id number]  DOS:  11/10/2021 Type of visit - description: CPX  Since the last office visit is doing well. He specifically denies chest pain, difficulty breathing, LUTS.   Review of Systems  A 14 point review of systems is negative    Past Medical History:  Diagnosis Date   HTN (hypertension)    Hypothyroidism    Learning disability    evaluated in the past by psych "borderline functioning range of inttelligence   Recurrent UTI 09/02/2013   Seizure ([place]) 09/2005   saw neuro, they didnt think he had sz (-) EEG    Past Surgical History:  Procedure Laterality Date   TONSILLECTOMY     VASECTOMY  2004   Social History   Socioeconomic History   Marital status: Single    Spouse name: Not on file   Number of children: 0   Years of education: Not on file   Highest education level: Not on file  Occupational History   Occupation: disability  Tobacco Use   Smoking status: Never   Smokeless tobacco: Never  Substance and Sexual Activity   Alcohol use: No   Drug use: No   Sexual activity: Not on file  Other Topics Concern   Not on file  Social History Narrative   Lost his mother , lives by himself, sisters check on him    Has 2 sister [name] [phone]), [name] ([phone])----> they are the POA    ADL- cooks his own meals, independent, not driving    Occ  plays the banjo    Social Determinants of Health   Financial Resource Strain: Not on file  Food Insecurity: Not on file  Transportation Needs: Not on file  Physical Activity: Not on file  Stress: Not on file  Social Connections: Not on file  Intimate Partner Violence: Not on file    Allergies as of 11/10/2021   No Known Allergies      Medication List        Accurate as of November 10, 2021 11:59 PM. If you have any questions, ask your nurse or doctor.          aspirin 81 MG tablet Take 81 mg by mouth daily.  Reported on 01/19/2016   atorvastatin 20 MG tablet Commonly known as: LIPITOR TAKE 1 TABLET(20 MG) BY MOUTH DAILY   levothyroxine 75 MCG tablet Commonly known as: SYNTHROID Take 1 tablet (75 mcg total) by mouth daily before breakfast.   lisinopril-hydrochlorothiazide 10-12.5 MG tablet Commonly known as: ZESTORETIC Take 1 tablet by mouth daily. DUE FOR TSH           Objective:   Physical Exam BP 122/82 (BP Location: Left Arm, Patient Position: Sitting, Cuff Size: Small)   Pulse 72   Temp 97.7 F (36.5 C) (Oral)   Resp 16   Ht 5\' 3"  (1.6 m)   Wt 160 lb (72.6 kg)   SpO2 98%   BMI 28.34 kg/m   General: Well developed, NAD, BMI noted Neck: No  thyromegaly  HEENT:  Normocephalic . Face symmetric, atraumatic Lungs:  CTA B Normal respiratory effort, no intercostal retractions, no accessory muscle use. Heart: RRR,  no murmur.  Abdomen:  Not distended, soft, non-tender. No rebound or rigidity. DRE: Normal sphincter tone, stools brown, prostate normal. Lower extremities: no pretibial edema bilaterally  Skin: Exposed areas without rash. Not pale. Not  jaundice Neurologic:  alert & oriented X3.  Speech normal, gait appropriate for age and unassisted Strength symmetric and appropriate for age.  Psych: Has a learning disability, answer all questions appropriately.  Behavior appropriate. No anxious or depressed appearing.     Assessment    Assessment HTN Hyperlipidemia  Hypothyroidism Recurrent UTIs last visit with urology 2012. Previous eval: CT 2010 bilateral renal cysts; cysto 2009: Unremarkable Learning disability ?Seizure, 2006 >>> saw neurology, EEG negative.not likely SZ FH CAD , father MI age 5   PLAN Here for CPX HTN: BP great today, continue Zestoretic, encouraged to check BPs at home.  Last creatinine elevated, rechecking today, encouraged good hydration Hyperlipidemia: Continue Lipitor, labs Hypothyroidism: Labs RTC 6 months     This visit  occurred during the SARS-CoV-2 public health emergency.  Safety protocols were in place, including screening questions prior to the visit, additional usage of staff PPE, and extensive cleaning of exam room while observing appropriate contact time as indicated for disinfecting solutions.

## 2021-11-11 ENCOUNTER — Encounter: Payer: Self-pay | Admitting: Internal Medicine

## 2021-11-11 NOTE — Assessment & Plan Note (Signed)
Assessment HTN Hyperlipidemia  Hypothyroidism Recurrent UTIs last visit with urology 2012. Previous eval: CT 2010 bilateral renal cysts; cysto 2009: Unremarkable Learning disability ?Seizure, 2006 >>> saw neurology, EEG negative.not likely SZ FH CAD , father MI age 63   PLAN Here for CPX HTN: BP great today, continue Zestoretic, encouraged to check BPs at home.  Last creatinine elevated, rechecking today, encouraged good hydration Hyperlipidemia: Continue Lipitor, labs Hypothyroidism: Labs RTC 6 months

## 2021-11-11 NOTE — Assessment & Plan Note (Signed)
-   Td  2019 - PNM shot 2016;  prevnar: 09-2016  -zostavax  - 2014   - shingrix d/w pt  - covid vax : recommend bivalent  booster  -had a Flu shot   -Prostate cancer screening: DRE normal today, check PSA -CCS: colonoscopy 11-2013,  Per letter 10 years -diet-exercise: Recommend to stay active and eat healthy - labs: CMP, FLP, CBC, TSH, PSA  -Continue with ASA due to [place] of CAD -Sisters are the POA, will communicate with them w/ labs results and the need for vaccines

## 2021-11-14 ENCOUNTER — Other Ambulatory Visit: Payer: Self-pay | Admitting: Internal Medicine

## 2021-11-24 ENCOUNTER — Other Ambulatory Visit: Payer: Self-pay | Admitting: Internal Medicine

## 2021-11-24 ENCOUNTER — Telehealth: Payer: Self-pay | Admitting: Internal Medicine

## 2021-11-24 MED ORDER — LEVOTHYROXINE SODIUM 75 MCG PO TABS: ORAL_TABLET | ORAL | 2 refills | Status: DC

## 2021-11-24 MED ORDER — LISINOPRIL-HYDROCHLOROTHIAZIDE 10-12.5 MG PO TABS: 1.0000 | ORAL_TABLET | Freq: Every day | ORAL | 2 refills | Status: DC

## 2021-11-24 NOTE — Telephone Encounter (Signed)
Medication: levothyroxine (SYNTHROID) 75 MCG tablet   Has the patient contacted their pharmacy? Yes.   (If no, request that the patient contact the pharmacy for the refill.) (If yes, when and what did the pharmacy advise?)  Preferred Pharmacy (with phone number or street name): [facility], [place] [address] [facility]  [address] [place] [address]  Phone:  [phone]  Fax:  [phone]   Agent: Please be advised that RX refills may take up to 3 business days. We ask that you follow-up with your pharmacy.

## 2021-11-24 NOTE — Telephone Encounter (Signed)
Rx sent 

## 2022-02-10 ENCOUNTER — Telehealth: Payer: Self-pay | Admitting: Internal Medicine

## 2022-02-10 NOTE — Telephone Encounter (Signed)
Left message for patient to call back and schedule Medicare Annual Wellness Visit (AWV) in office.   If not able to come in office, please offer to do virtually or by telephone.  Left office number and my jabber [phone].  Last AWV:03/28/2017  Please schedule at anytime with Nurse Health Advisor.

## 2022-02-15 ENCOUNTER — Other Ambulatory Visit: Payer: Self-pay | Admitting: Internal Medicine

## 2022-03-02 ENCOUNTER — Telehealth: Payer: Self-pay | Admitting: Internal Medicine

## 2022-03-02 NOTE — Telephone Encounter (Signed)
Left message for patient to call back and schedule Medicare Annual Wellness Visit (AWV) in office.  ? ?If not able to come in office, please offer to do virtually or by telephone.  Left office number and my jabber [phone]. ? ?Last AWV:03/28/2017 ? ?Please schedule at anytime with Nurse Health Advisor. ?  ?

## 2022-05-10 ENCOUNTER — Ambulatory Visit: Payer: Medicare Other | Admitting: Internal Medicine

## 2022-05-17 ENCOUNTER — Telehealth (INDEPENDENT_AMBULATORY_CARE_PROVIDER_SITE_OTHER): Payer: Medicare Other | Admitting: Internal Medicine

## 2022-05-17 ENCOUNTER — Encounter: Payer: Self-pay | Admitting: Internal Medicine

## 2022-05-17 VITALS — Ht 63.0 in

## 2022-05-17 DIAGNOSIS — I10 Essential (primary) hypertension: Secondary | ICD-10-CM | POA: Diagnosis not present

## 2022-05-17 DIAGNOSIS — E034 Atrophy of thyroid (acquired): Secondary | ICD-10-CM

## 2022-05-17 NOTE — Assessment & Plan Note (Signed)
3-monthfollow-up HTN: Reports good compliance with Zestoretic, no recent ambulatory BPs.  Encouraged to check.  Obtain a BMP. Hyperlipidemia: On atorvastatin, well-controlled.  No change Hypothyroidism: Reports good compliance with Synthroid, check a TSH. Patient education: Encourage healthy diet and stay active.  Also advised a COVID vaccination booster is an option for him. RF medicines as needed. RTC 6 months CPX We will call patient and set up labs.

## 2022-05-17 NOTE — Progress Notes (Signed)
   Subjective:    Patient ID: Dean Dennis, male    DOB: Apr 21, 1956, 66 y.o.   MRN: [id number]  DOS:  05/17/2022 Type of visit - description: Virtual Visit via Telephone    I connected with above mentioned patient  by telephone and verified that I am speaking with the correct person using two identifiers.    Location of patient: home  Location of provider: office  Persons participating in the virtual visit: patient, provider   I discussed the limitations, risks, security and privacy concerns of performing an evaluation and management service by telephone and the availability of in person appointments. I also discussed with the patient that there may be a patient responsible charge related to this service. The patient expressed understanding and agreed to proceed.   Routine visit. Here for 78-monthfollow-up. Reports good compliance with medication. No recent ambulatory blood pressure readings. He specifically denies chest pain or difficulty breathing No nausea vomiting No dysuria.   Review of Systems See above   Past Medical History:  Diagnosis Date   HTN (hypertension)    Hypothyroidism    Learning disability    evaluated in the past by psych "borderline functioning range of inttelligence   Recurrent UTI 09/02/2013   Seizure ([place] 09/2005   saw neuro, they didnt think he had sz (-) EEG    Past Surgical History:  Procedure Laterality Date   TONSILLECTOMY     VASECTOMY  2004    Current Outpatient Medications  Medication Instructions   aspirin 81 mg, Oral, Daily, Reported on 01/19/2016   atorvastatin (LIPITOR) 20 MG tablet TAKE 1 TABLET(20 MG) BY MOUTH DAILY   levothyroxine (SYNTHROID) 75 MCG tablet TAKE 1 TABLET(75 MCG) BY MOUTH DAILY BEFORE BREAKFAST   lisinopril-hydrochlorothiazide (ZESTORETIC) 10-12.5 MG tablet 1 tablet, Oral, Daily       Objective:   Physical Exam Ht '5\' 3"'$  (1.6 m)   BMI 28.34 kg/m      Assessment      Assessment HTN Hyperlipidemia   Hypothyroidism Recurrent UTIs last visit with urology 2012. Previous eval: CT 2010 bilateral renal cysts; cysto 2009: Unremarkable Learning disability ?Seizure, 2006 >>> saw neurology, EEG negative.not likely SZ FH CAD , father MI age 64  PLAN 663-monthollow-up HTN: Reports good compliance with Zestoretic, no recent ambulatory BPs.  Encouraged to check.  Obtain a BMP. Hyperlipidemia: On atorvastatin, well-controlled.  No change Hypothyroidism: Reports good compliance with Synthroid, check a TSH. Patient education: Encourage healthy diet and stay active.  Also advised a COVID vaccination booster is an option for him. RF medicines as needed. RTC 6 months CPX We will call patient and set up labs.   I discussed the assessment and treatment plan with the patient. The patient was provided an opportunity to ask questions and all were answered. The patient agreed with the plan and demonstrated an understanding of the instructions.   The patient was advised to call back or seek an in-person evaluation if the symptoms worsen or if the condition fails to improve as anticipated.  I provided 19 minutes of non-face-to-face time during this encounter.  JoKathlene NovemberMD

## 2022-05-23 ENCOUNTER — Other Ambulatory Visit (INDEPENDENT_AMBULATORY_CARE_PROVIDER_SITE_OTHER): Payer: Medicare Other

## 2022-05-23 DIAGNOSIS — E034 Atrophy of thyroid (acquired): Secondary | ICD-10-CM | POA: Diagnosis not present

## 2022-05-23 DIAGNOSIS — I10 Essential (primary) hypertension: Secondary | ICD-10-CM

## 2022-05-23 LAB — BASIC METABOLIC PANEL
BUN: 22 mg/dL (ref 6–23)
CO2: 32 mEq/L (ref 19–32)
Calcium: 9.9 mg/dL (ref 8.4–10.5)
Chloride: 99 mEq/L (ref 96–112)
Creatinine, Ser: 1.3 mg/dL (ref 0.40–1.50)
GFR: 58.45 mL/min — ABNORMAL LOW (ref >60.00)
Glucose, Bld: 77 mg/dL (ref 70–99)
Potassium: 3.9 mEq/L (ref 3.5–5.1)
Sodium: 140 mEq/L (ref 135–145)

## 2022-05-23 LAB — TSH: TSH: 2.82 u[IU]/mL (ref 0.35–5.50)

## 2022-05-31 ENCOUNTER — Telehealth: Payer: Self-pay | Admitting: Internal Medicine

## 2022-05-31 NOTE — Telephone Encounter (Signed)
Left message for patient to call back and schedule Medicare Annual Wellness Visit (AWV).   Please offer to do virtually or by telephone.  Left office number and my jabber #[phone].  Last AWV:03/28/2017  Please schedule at anytime with Nurse Health Advisor.   

## 2022-07-21 ENCOUNTER — Telehealth: Payer: Self-pay | Admitting: Internal Medicine

## 2022-07-21 NOTE — Telephone Encounter (Signed)
Left message for patient to call back and schedule Medicare Annual Wellness Visit (AWV).   Please offer to do virtually or by telephone.  Left office number and my jabber [phone].  Last AWV:03/28/2017  Please schedule at anytime with Nurse Health Advisor.

## 2022-08-14 ENCOUNTER — Other Ambulatory Visit: Payer: Self-pay | Admitting: Internal Medicine

## 2022-08-24 ENCOUNTER — Encounter: Payer: Self-pay | Admitting: Internal Medicine

## 2022-09-06 ENCOUNTER — Encounter: Payer: Self-pay | Admitting: Internal Medicine

## 2022-11-12 ENCOUNTER — Other Ambulatory Visit: Payer: Self-pay | Admitting: Internal Medicine

## 2022-11-13 ENCOUNTER — Encounter: Payer: Medicare Other | Admitting: Internal Medicine

## 2022-11-14 ENCOUNTER — Encounter: Payer: Medicare Other | Admitting: Internal Medicine

## 2022-11-20 ENCOUNTER — Ambulatory Visit (INDEPENDENT_AMBULATORY_CARE_PROVIDER_SITE_OTHER): Payer: Medicare Other | Admitting: Internal Medicine

## 2022-11-20 ENCOUNTER — Encounter: Payer: Self-pay | Admitting: Internal Medicine

## 2022-11-20 VITALS — BP 116/72 | HR 55 | Temp 98.3°F | Resp 16 | Ht 63.0 in | Wt 169.1 lb

## 2022-11-20 DIAGNOSIS — I10 Essential (primary) hypertension: Secondary | ICD-10-CM

## 2022-11-20 DIAGNOSIS — Z0000 Encounter for general adult medical examination without abnormal findings: Secondary | ICD-10-CM | POA: Diagnosis not present

## 2022-11-20 DIAGNOSIS — E034 Atrophy of thyroid (acquired): Secondary | ICD-10-CM

## 2022-11-20 DIAGNOSIS — E785 Hyperlipidemia, unspecified: Secondary | ICD-10-CM | POA: Diagnosis not present

## 2022-11-20 DIAGNOSIS — R7989 Other specified abnormal findings of blood chemistry: Secondary | ICD-10-CM

## 2022-11-20 NOTE — Patient Instructions (Addendum)
Vaccines I recommend:  Shingrix (shingles) Covid booster RSV vaccine  Please bring [place] a copy of your Healthcare Power of Attorney   Check the  blood pressure regularly BP GOAL is between 110/65 and  135/85. If it is consistently higher or lower, let me know     GO TO THE LAB : Get the blood work     [place], [place] back for   a checkup in 6 months

## 2022-11-20 NOTE — Assessment & Plan Note (Addendum)
-   Td  2019 - PNM shot 2016;  prevnar: 09-2016  -zostavax  - 2014   - RSV d/w pt  - shingrix d/w pt  - covid vax : recommend bivalent  booster  -had a Flu shot   -Prostate cancer screening: DRE normal last year, PSA was 3.0.  Repeat PSA -CCS: colonoscopy 11-2013,  next per letter 10 years -diet-exercise: Reports she is doing well, takes walks frequently. - labs:  CMP FLP CBC TSH PSA (also d/t abnormal CBC, check  B44 folic acid) -Continue with ASA due to [place] of CAD -Sisters are the POA, will communicate with them w/ labs results and the need for vaccines

## 2022-11-20 NOTE — Assessment & Plan Note (Signed)
Here for CPX HTN: On Zestoretic, BP today is very good, checking labs. Hyperlipidemia: On atorvastatin, checking labs Hypothyroidism: Reports good compliance with Synthroid.  Labs. Recurrent UTIs: History of, no symptoms. FH CAD: On aspirin.  Controlling CV RF. Learning disability: lives alone, sisters check on him RTC 6 months

## 2022-11-20 NOTE — Progress Notes (Signed)
Subjective:    Patient ID: Dean Dennis, male    DOB: [birth year], 66 y.o.   MRN: [id number]  DOS:  11/20/2022 Type of visit - description: CPX  Here for CPX. In general feeling well. He specifically denies chest pain or difficulty breathing No headache or dizziness No GI or GU symptoms  Review of Systems   A 14 point review of systems is negative    Past Medical History:  Diagnosis Date   HTN (hypertension)    Hypothyroidism    Learning disability    evaluated in the past by psych "borderline functioning range of inttelligence   Recurrent UTI 09/02/2013   Seizure ([place]) 09/2005   saw neuro, they didnt think he had sz (-) EEG    Past Surgical History:  Procedure Laterality Date   TONSILLECTOMY     VASECTOMY  2004   Social History   Socioeconomic History   Marital status: Single    Spouse name: Not on file   Number of children: 0   Years of education: Not on file   Highest education level: Not on file  Occupational History   Occupation: disability  Tobacco Use   Smoking status: Never   Smokeless tobacco: Never  Substance and Sexual Activity   Alcohol use: No   Drug use: No   Sexual activity: Not on file  Other Topics Concern   Not on file  Social History Narrative   Lost his mother , lives by himself, sisters check on him    Has 2 sister [name] [phone]), [name] [phone])----> they are the POA    ADL- cooks his own meals, independent, not driving    Occ  plays the banjo    Social Determinants of Health   Financial Resource Strain: Not on file  Food Insecurity: Not on file  Transportation Needs: Not on file  Physical Activity: Not on file  Stress: Not on file  Social Connections: Not on file  Intimate Partner Violence: Not on file     Current Outpatient Medications  Medication Instructions   aspirin 81 mg, Oral, Daily, Reported on 01/19/2016   atorvastatin (LIPITOR) 20 MG tablet TAKE 1 TABLET(20 MG) BY MOUTH DAILY   levothyroxine  (SYNTHROID) 75 MCG tablet TAKE 1 TABLET(75 MCG) BY MOUTH DAILY BEFORE BREAKFAST   lisinopril-hydrochlorothiazide (ZESTORETIC) 10-12.5 MG tablet 1 tablet, Oral, Daily       Objective:   Physical Exam BP 116/72   Pulse (!) 55   Temp 98.3 F (36.8 C) (Oral)   Resp 16   Ht '5\' 3"'$  (1.6 m)   Wt 169 lb 2 oz (76.7 kg)   SpO2 94%   BMI 29.96 kg/m  General: Well developed, NAD, BMI noted Neck: No  thyromegaly  HEENT:  Normocephalic . Face symmetric, atraumatic Lungs:  CTA B Normal respiratory effort, no intercostal retractions, no accessory muscle use. Heart: RRR,  no murmur.  Abdomen:  Not distended, soft, non-tender. No rebound or rigidity.   Lower extremities: no pretibial edema bilaterally  Skin: Exposed areas without rash. Not pale. Not jaundice Neurologic:  alert & oriented X3.  Speech normal although somewhat slow because he does not has his denture, gait appropriate for age and unassisted Strength symmetric and appropriate for age.  Psych:  Cooperative with normal attention span and concentration.  Behavior appropriate. No anxious or depressed appearing.     Assessment    Assessment HTN Hyperlipidemia  Hypothyroidism Recurrent UTIs last visit with urology 2012. Previous eval:  CT 2010 bilateral renal cysts; cysto 2009: Unremarkable Learning disability ?Seizure, 2006 >>> saw neurology, EEG negative.not likely SZ FH CAD , father MI age 59   PLAN Here for CPX HTN: On Zestoretic, BP today is very good, checking labs. Hyperlipidemia: On atorvastatin, checking labs Hypothyroidism: Reports good compliance with Synthroid.  Labs. Recurrent UTIs: History of, no symptoms. FH CAD: On aspirin.  Controlling CV RF. Learning disability: lives alone, sisters check on him RTC 6 months

## 2022-11-21 LAB — COMPREHENSIVE METABOLIC PANEL
ALT: 25 U/L (ref 0–53)
AST: 19 U/L (ref 0–37)
Albumin: 4.8 g/dL (ref 3.5–5.2)
Alkaline Phosphatase: 88 U/L (ref 39–117)
BUN: 22 mg/dL (ref 6–23)
CO2: 32 mEq/L (ref 19–32)
Calcium: 9.5 mg/dL (ref 8.4–10.5)
Chloride: 101 mEq/L (ref 96–112)
Creatinine, Ser: 1.2 mg/dL (ref 0.40–1.50)
GFR: 64.11 mL/min (ref >60.00)
Glucose, Bld: 82 mg/dL (ref 70–99)
Potassium: 3.7 mEq/L (ref 3.5–5.1)
Sodium: 141 mEq/L (ref 135–145)
Total Bilirubin: 0.6 mg/dL (ref 0.2–1.2)
Total Protein: 7.3 g/dL (ref 6.0–8.3)

## 2022-11-21 LAB — PSA: PSA: 2.18 ng/mL (ref 0.10–4.00)

## 2022-11-21 LAB — TSH: TSH: 3.8 u[IU]/mL (ref 0.35–5.50)

## 2022-11-21 LAB — LIPID PANEL
Cholesterol: 134 mg/dL (ref 0–200)
HDL: 52.7 mg/dL (ref >39.00)
LDL Cholesterol: 65 mg/dL (ref 0–99)
NonHDL: 81.61
Total CHOL/HDL Ratio: 3
Triglycerides: 84 mg/dL (ref 0.0–149.0)
VLDL: 16.8 mg/dL (ref 0.0–40.0)

## 2022-11-21 LAB — CBC WITH DIFFERENTIAL/PLATELET
Basophils Absolute: 0 10*3/uL (ref 0.0–0.1)
Basophils Relative: 0.3 % (ref 0.0–3.0)
Eosinophils Absolute: 0.3 10*3/uL (ref 0.0–0.7)
Eosinophils Relative: 4.2 % (ref 0.0–5.0)
HCT: 40 % (ref 39.0–52.0)
Hemoglobin: 13.8 g/dL (ref 13.0–17.0)
Lymphocytes Relative: 31.6 % (ref 12.0–46.0)
Lymphs Abs: 2.4 10*3/uL (ref 0.7–4.0)
MCHC: 34.6 g/dL (ref 30.0–36.0)
MCV: 100.7 fl — ABNORMAL HIGH (ref 78.0–100.0)
Monocytes Absolute: 0.9 10*3/uL (ref 0.1–1.0)
Monocytes Relative: 11.9 % (ref 3.0–12.0)
Neutro Abs: 3.9 10*3/uL (ref 1.4–7.7)
Neutrophils Relative %: 52 % (ref 43.0–77.0)
Platelets: 270 10*3/uL (ref 150.0–400.0)
RBC: 3.97 Mil/uL — ABNORMAL LOW (ref 4.22–5.81)
RDW: 13.6 % (ref 11.5–15.5)
WBC: 7.5 10*3/uL (ref 4.0–10.5)

## 2022-11-21 LAB — B12 AND FOLATE PANEL
Folate: 7.5 ng/mL (ref >5.9)
Vitamin B-12: 298 pg/mL (ref 211–911)

## 2023-02-10 ENCOUNTER — Other Ambulatory Visit: Payer: Self-pay | Admitting: Internal Medicine

## 2023-02-10 ENCOUNTER — Other Ambulatory Visit: Payer: Self-pay | Admitting: Family

## 2023-02-13 ENCOUNTER — Other Ambulatory Visit: Payer: Self-pay

## 2023-02-13 MED ORDER — LEVOTHYROXINE SODIUM 75 MCG PO TABS: 75.0000 ug | ORAL_TABLET | Freq: Every day | ORAL | 1 refills | Status: DC

## 2023-02-13 MED ORDER — ATORVASTATIN CALCIUM 20 MG PO TABS: ORAL_TABLET | ORAL | 1 refills | Status: DC

## 2023-02-16 ENCOUNTER — Other Ambulatory Visit: Payer: Self-pay | Admitting: Internal Medicine

## 2023-05-22 ENCOUNTER — Ambulatory Visit: Payer: 59 | Admitting: Internal Medicine

## 2023-05-29 ENCOUNTER — Ambulatory Visit (INDEPENDENT_AMBULATORY_CARE_PROVIDER_SITE_OTHER): Payer: 59 | Admitting: Internal Medicine

## 2023-05-29 ENCOUNTER — Encounter: Payer: Self-pay | Admitting: Internal Medicine

## 2023-05-29 VITALS — BP 122/80 | HR 51 | Temp 98.2°F | Resp 16 | Ht 63.0 in | Wt 166.4 lb

## 2023-05-29 DIAGNOSIS — I10 Essential (primary) hypertension: Secondary | ICD-10-CM

## 2023-05-29 DIAGNOSIS — E034 Atrophy of thyroid (acquired): Secondary | ICD-10-CM

## 2023-05-29 NOTE — Patient Instructions (Addendum)
Vaccines I recommend:  Covid booster Shingrix (shingles) RSV vaccine  Please bring [place] a copy of your Healthcare Power of Attorney for your chart.   Check the  blood pressure regularly BP GOAL is between 110/65 and  135/85. If it is consistently higher or lower, let me know   GO TO THE LAB : Get the blood work     GO TO THE FRONT DESK, PLEASE SCHEDULE YOUR APPOINTMENTS Come back for   physical exam by 10-2023

## 2023-05-29 NOTE — Progress Notes (Unsigned)
   Subjective:    Patient ID: NADINE FUTTERMAN, male    DOB: 05/28/1956, 67 y.o.   MRN: [id number]  DOS:  05/29/2023 Type of visit - description: Follow-up Routine checkup Feels well. Denies any chest pain no difficulty breathing No dysuria or gross hematuria. Good med compliance.   Review of Systems See above   Past Medical History:  Diagnosis Date   HTN (hypertension)    Hypothyroidism    Learning disability    evaluated in the past by psych "borderline functioning range of inttelligence   Recurrent UTI 09/02/2013   Seizure ([facility]) 09/2005   saw neuro, they didnt think he had sz (-) EEG    Past Surgical History:  Procedure Laterality Date   TONSILLECTOMY     VASECTOMY  2004    Current Outpatient Medications  Medication Instructions   aspirin 81 mg, Oral, Daily, Reported on 01/19/2016   atorvastatin (LIPITOR) 20 MG tablet TAKE 1 TABLET(20 MG) BY MOUTH DAILY   levothyroxine (SYNTHROID) 75 mcg, Oral, Daily before breakfast   lisinopril-hydrochlorothiazide (ZESTORETIC) 10-12.5 MG tablet 1 tablet, Oral, Daily       Objective:   Physical Exam BP 122/80   Pulse (!) 51   Temp 98.2 F (36.8 C) (Oral)   Resp 16   Ht 5\' 3"  (1.6 m)   Wt 166 lb 6 oz (75.5 kg)   SpO2 98%   BMI 29.47 kg/m  General:   Well developed, NAD, BMI noted. HEENT:  Normocephalic . Face symmetric, atraumatic Lungs:  CTA B Normal respiratory effort, no intercostal retractions, no accessory muscle use. Heart: RRR,  no murmur.  Lower extremities: no pretibial edema bilaterally  Skin: Not pale. Not jaundice Neurologic:  alert & oriented X3.  Speech normal, gait appropriate for age and unassisted Psych--  Behavior appropriate. No anxious or depressed appearing.      Assessment    Assessment HTN Hyperlipidemia  Hypothyroidism Recurrent UTIs last visit with urology 2012. Previous eval: CT 2010 bilateral renal cysts; cysto 2009: Unremarkable Learning disability ?Seizure, 2006 >>> saw  neurology, EEG negative.not likely SZ FH CAD , father MI age 58   PLAN HTN: BP looks good, on Zestoretic, recommend to check ambulatory BPs from time to time, check BMP Hypothyroidism: On Synthroid, check TSH. Social: Lives by himself, reports she is active, rides his bike frequently. RTC 10-2023 for CPX

## 2023-05-30 LAB — BASIC METABOLIC PANEL
BUN: 21 mg/dL (ref 6–23)
CO2: 25 mEq/L (ref 19–32)
Calcium: 9.2 mg/dL (ref 8.4–10.5)
Chloride: 101 mEq/L (ref 96–112)
Creatinine, Ser: 1.33 mg/dL (ref 0.40–1.50)
GFR: 56.46 mL/min — ABNORMAL LOW (ref >60.00)
Glucose, Bld: 74 mg/dL (ref 70–99)
Potassium: 3.7 mEq/L (ref 3.5–5.1)
Sodium: 140 mEq/L (ref 135–145)

## 2023-05-30 LAB — TSH: TSH: 3.46 u[IU]/mL (ref 0.35–5.50)

## 2023-05-31 MED ORDER — LEVOTHYROXINE SODIUM 100 MCG PO TABS: 100.0000 ug | ORAL_TABLET | Freq: Every day | ORAL | 0 refills | Status: DC

## 2023-05-31 NOTE — Assessment & Plan Note (Signed)
HTN: BP looks good, on Zestoretic, recommend to check ambulatory BPs from time to time, check BMP Hypothyroidism: On Synthroid, check TSH. Social: Lives by himself, reports she is active, rides his bike frequently. RTC 10-2023 for CPX

## 2023-08-09 ENCOUNTER — Other Ambulatory Visit: Payer: Self-pay | Admitting: Internal Medicine

## 2023-08-23 ENCOUNTER — Other Ambulatory Visit: Payer: Self-pay | Admitting: Internal Medicine

## 2023-09-10 ENCOUNTER — Encounter: Payer: Self-pay | Admitting: Internal Medicine

## 2023-11-19 ENCOUNTER — Encounter: Payer: Self-pay | Admitting: Internal Medicine

## 2023-11-26 ENCOUNTER — Encounter: Payer: Self-pay | Admitting: Internal Medicine

## 2023-11-26 ENCOUNTER — Encounter: Payer: 59 | Admitting: Internal Medicine

## 2023-12-05 ENCOUNTER — Encounter: Payer: 59 | Admitting: Internal Medicine

## 2024-01-25 ENCOUNTER — Encounter: Payer: 59 | Admitting: Internal Medicine

## 2024-01-30 ENCOUNTER — Encounter: Payer: 59 | Admitting: Medical

## 2024-02-12 ENCOUNTER — Other Ambulatory Visit: Payer: Self-pay | Admitting: Internal Medicine

## 2024-02-12 MED ORDER — LEVOTHYROXINE SODIUM 100 MCG PO TABS: 100.0000 ug | ORAL_TABLET | Freq: Every day | ORAL | 0 refills | Status: DC

## 2024-03-12 ENCOUNTER — Encounter: Payer: 59 | Admitting: Internal Medicine

## 2024-03-25 ENCOUNTER — Encounter: Payer: Self-pay | Admitting: Internal Medicine

## 2024-03-25 ENCOUNTER — Ambulatory Visit (INDEPENDENT_AMBULATORY_CARE_PROVIDER_SITE_OTHER): Admitting: Internal Medicine

## 2024-03-25 VITALS — BP 126/84 | HR 51 | Temp 98.3°F | Resp 16 | Ht 63.0 in | Wt 159.1 lb

## 2024-03-25 DIAGNOSIS — R7989 Other specified abnormal findings of blood chemistry: Secondary | ICD-10-CM | POA: Diagnosis not present

## 2024-03-25 DIAGNOSIS — I10 Essential (primary) hypertension: Secondary | ICD-10-CM | POA: Diagnosis not present

## 2024-03-25 DIAGNOSIS — L989 Disorder of the skin and subcutaneous tissue, unspecified: Secondary | ICD-10-CM | POA: Diagnosis not present

## 2024-03-25 DIAGNOSIS — F819 Developmental disorder of scholastic skills, unspecified: Secondary | ICD-10-CM

## 2024-03-25 DIAGNOSIS — Z0000 Encounter for general adult medical examination without abnormal findings: Secondary | ICD-10-CM | POA: Diagnosis not present

## 2024-03-25 DIAGNOSIS — Z1211 Encounter for screening for malignant neoplasm of colon: Secondary | ICD-10-CM

## 2024-03-25 DIAGNOSIS — E034 Atrophy of thyroid (acquired): Secondary | ICD-10-CM

## 2024-03-25 DIAGNOSIS — E785 Hyperlipidemia, unspecified: Secondary | ICD-10-CM | POA: Diagnosis not present

## 2024-03-25 DIAGNOSIS — Z0001 Encounter for general adult medical examination with abnormal findings: Secondary | ICD-10-CM

## 2024-03-25 NOTE — Progress Notes (Unsigned)
   Subjective:    Patient ID: Dean Dennis, male    DOB: 1956-01-24, 68 y.o.   MRN: [id number]  DOS:  03/25/2024 Type of visit - description: CPX  Here for CPX. No major complaints. Did report some wrist pain before but that is apparently resolved.   Review of Systems See above   Past Medical History:  Diagnosis Date   HTN (hypertension)    Hypothyroidism    Learning disability    evaluated in the past by psych "borderline functioning range of inttelligence   Recurrent UTI 09/02/2013   Seizure ([facility]) 09/2005   saw neuro, they didnt think he had sz (-) EEG    Past Surgical History:  Procedure Laterality Date   TONSILLECTOMY     VASECTOMY  2004    Current Outpatient Medications  Medication Instructions   aspirin 81 mg, Daily   atorvastatin (LIPITOR) 20 mg, Oral, Daily   levothyroxine (SYNTHROID) 100 mcg, Oral, Daily before breakfast   lisinopril-hydrochlorothiazide (ZESTORETIC) 10-12.5 MG tablet 1 tablet, Oral, Daily       Objective:   Physical Exam BP 126/84   Pulse (!) 51   Temp 98.3 F (36.8 C) (Oral)   Resp 16   Ht 5\' 3"  (1.6 m)   Wt 159 lb 2 oz (72.2 kg)   SpO2 97%   BMI 28.19 kg/m  General: Well developed, NAD, BMI noted Neck: No  thyromegaly  HEENT:  Normocephalic . Face symmetric, atraumatic Lungs:  CTA B Normal respiratory effort, no intercostal retractions, no accessory muscle use. Heart: RRR,  no murmur.  Abdomen:  Not distended, soft, non-tender. No rebound or rigidity.   Lower extremities: no pretibial edema bilaterally  Skin: Has a skin lesion at the right temple.  See picture Neurologic:  alert & oriented X3.  Strength symmetric and appropriate for age.  Psych:   Behavior appropriate. No anxious or depressed appearing.     Assessment     Assessment HTN Hyperlipidemia  Hypothyroidism Recurrent UTIs last visit with urology 2012. Previous eval: CT 2010 bilateral renal cysts; cysto 2009: Unremarkable Increase MCV, normal folic  acid and B12 Learning disability ?Seizure, 2006 >>> saw neurology, EEG negative.not likely SZ FH CAD , father MI age 43   PLAN Here for CPX -Td  2019 - PNM shot 2016;  prevnar: 09-2016  -zostavax  - 2014   -Vaccines I recommend: Shingrix, PNM 20, COVID booster. -Prostate cancer screening:  Repeat PSA -CCS: colonoscopy 11-2013,  next per letter 10 years.  Due, referral sent. -diet-exercise: Reports doing well, using his bicycle again, states he is wearing a helmet consistently. - labs:  CMP FLP CBC PSA   TSH -Continue with ASA due to FH of CAD We also discussed the following: HTN: On Zestoretic, BP looks good, checking labs Hyperlipidemia: On atorvastatin 20 mg checking labs. Hypothyroidism: On Synthroid, check a TSH. Will send a message to Qadir's sisters, they have the healthcare power of attorney regards: - Referral to GI for a colonoscopy - Referral to dermatology for a skin lesion at the right temple - Vaccinations RTC 6 months    HTN: BP looks good, on Zestoretic, recommend to check ambulatory BPs from time to time, check BMP Hypothyroidism: On Synthroid, check TSH. Social: Lives by himself, reports she is active, rides his bike frequently. RTC 10-2023 for CPX

## 2024-03-25 NOTE — Patient Instructions (Addendum)
 Vaccines I recommend:  Shingrix (shingles) Covid booster Prevnar 20 RSV vaccine     GO TO THE LAB : Get the blood work     Please go to the front desk: Arrange for a follow-up in 6 months

## 2024-03-26 ENCOUNTER — Encounter: Payer: Self-pay | Admitting: Internal Medicine

## 2024-03-26 LAB — COMPREHENSIVE METABOLIC PANEL WITH GFR
ALT: 26 U/L (ref 0–53)
AST: 19 U/L (ref 0–37)
Albumin: 4.7 g/dL (ref 3.5–5.2)
Alkaline Phosphatase: 86 U/L (ref 39–117)
BUN: 20 mg/dL (ref 6–23)
CO2: 29 meq/L (ref 19–32)
Calcium: 9.5 mg/dL (ref 8.4–10.5)
Chloride: 98 meq/L (ref 96–112)
Creatinine, Ser: 1.4 mg/dL (ref 0.40–1.50)
GFR: 52.79 mL/min — ABNORMAL LOW (ref >60.00)
Glucose, Bld: 83 mg/dL (ref 70–99)
Potassium: 3.9 meq/L (ref 3.5–5.1)
Sodium: 139 meq/L (ref 135–145)
Total Bilirubin: 0.6 mg/dL (ref 0.2–1.2)
Total Protein: 7.2 g/dL (ref 6.0–8.3)

## 2024-03-26 LAB — CBC WITH DIFFERENTIAL/PLATELET
Basophils Absolute: 0.1 10*3/uL (ref 0.0–0.1)
Basophils Relative: 1.5 % (ref 0.0–3.0)
Eosinophils Absolute: 0.3 10*3/uL (ref 0.0–0.7)
Eosinophils Relative: 2.9 % (ref 0.0–5.0)
HCT: 40.4 % (ref 39.0–52.0)
Hemoglobin: 13.9 g/dL (ref 13.0–17.0)
Lymphocytes Relative: 24.1 % (ref 12.0–46.0)
Lymphs Abs: 2.1 10*3/uL (ref 0.7–4.0)
MCHC: 34.5 g/dL (ref 30.0–36.0)
MCV: 101.1 fl — ABNORMAL HIGH (ref 78.0–100.0)
Monocytes Absolute: 0.9 10*3/uL (ref 0.1–1.0)
Monocytes Relative: 10.6 % (ref 3.0–12.0)
Neutro Abs: 5.3 10*3/uL (ref 1.4–7.7)
Neutrophils Relative %: 60.9 % (ref 43.0–77.0)
Platelets: 300 10*3/uL (ref 150.0–400.0)
RBC: 4 Mil/uL — ABNORMAL LOW (ref 4.22–5.81)
RDW: 13.4 % (ref 11.5–15.5)
WBC: 8.7 10*3/uL (ref 4.0–10.5)

## 2024-03-26 LAB — LIPID PANEL
Cholesterol: 130 mg/dL (ref 0–200)
HDL: 53.4 mg/dL (ref >39.00)
LDL Cholesterol: 63 mg/dL (ref 0–99)
NonHDL: 76.12
Total CHOL/HDL Ratio: 2
Triglycerides: 68 mg/dL (ref 0.0–149.0)
VLDL: 13.6 mg/dL (ref 0.0–40.0)

## 2024-03-26 LAB — PSA: PSA: 3.21 ng/mL (ref 0.10–4.00)

## 2024-03-26 LAB — TSH: TSH: 0.91 u[IU]/mL (ref 0.35–5.50)

## 2024-03-26 NOTE — Assessment & Plan Note (Signed)
 Here for CPX -Td  2019 - PNM shot 2016;  prevnar: 09-2016  -zostavax  - 2014   -Vaccines I recommend: Shingrix, PNM 20, COVID booster. -Prostate cancer screening:  Repeat PSA -CCS: colonoscopy 11-2013,  next per letter 10 years.  Due, referral sent. -diet-exercise: Reports doing well, using his bicycle again, states he is wearing a helmet consistently. - labs:  CMP FLP CBC PSA   TSH -Continue with ASA due to FH of CAD

## 2024-03-26 NOTE — Assessment & Plan Note (Signed)
 Here for CPX  We also discussed the following: HTN: On Zestoretic, BP looks good, checking labs Hyperlipidemia: On atorvastatin 20 mg checking labs. Hypothyroidism: On Synthroid, check a TSH. Will send a message to [name]'s sisters, they have the healthcare power of attorney regards: - Referral to GI for a colonoscopy - Referral to dermatology for a skin lesion at the right temple - Vaccinations RTC 6 months

## 2024-03-27 ENCOUNTER — Encounter: Payer: Self-pay | Admitting: Internal Medicine

## 2024-04-09 ENCOUNTER — Encounter: Payer: Self-pay | Admitting: Internal Medicine

## 2024-05-05 ENCOUNTER — Telehealth: Payer: Self-pay

## 2024-05-05 DIAGNOSIS — L989 Disorder of the skin and subcutaneous tissue, unspecified: Secondary | ICD-10-CM

## 2024-05-05 DIAGNOSIS — F819 Developmental disorder of scholastic skills, unspecified: Secondary | ICD-10-CM

## 2024-05-05 NOTE — Telephone Encounter (Signed)
 Copied from CRM [id number]. Topic: Referral - Request for Referral >> May 05, 2024 12:10 PM [place] A wrote: Did the patient discuss referral with their provider in the last year? Yes (If No - schedule appointment) (If Yes - send message)  Appointment offered? No  Type of order/referral and detailed reason for visit: Dermatologist  Preference of office, provider, location: [facility]  If referral order, have you been seen by this specialty before? Yes (If Yes, this issue or another issue? When? Where? Over 10+ years  Can we respond through MyChart? Yes

## 2024-05-05 NOTE — Addendum Note (Signed)
 Addended by: Skippy Marhefka D on: 05/05/2024 01:22 PM   Modules accepted: Orders

## 2024-05-05 NOTE — Telephone Encounter (Addendum)
 Pt previously seen at [facility] in 2014, referral was placed back to their office on 03/25/2024. However, received fax today that [place] [facility] is out of network for medicaid. I believe [facility] is the only dermatology in the area that accepts medicaid.

## 2024-05-07 ENCOUNTER — Other Ambulatory Visit: Payer: Self-pay | Admitting: Internal Medicine

## 2024-05-11 ENCOUNTER — Other Ambulatory Visit: Payer: Self-pay | Admitting: Family

## 2024-05-15 ENCOUNTER — Other Ambulatory Visit: Payer: Self-pay | Admitting: Internal Medicine

## 2024-05-15 ENCOUNTER — Other Ambulatory Visit: Payer: Self-pay

## 2024-05-15 MED ORDER — LEVOTHYROXINE SODIUM 100 MCG PO TABS: 100.0000 ug | ORAL_TABLET | Freq: Every day | ORAL | 1 refills | Status: DC

## 2024-05-26 ENCOUNTER — Encounter: Payer: Self-pay | Admitting: Physician Assistant

## 2024-05-26 ENCOUNTER — Ambulatory Visit (INDEPENDENT_AMBULATORY_CARE_PROVIDER_SITE_OTHER): Admitting: Physician Assistant

## 2024-05-26 VITALS — BP 118/65

## 2024-05-26 DIAGNOSIS — L820 Inflamed seborrheic keratosis: Secondary | ICD-10-CM

## 2024-05-26 DIAGNOSIS — D492 Neoplasm of unspecified behavior of bone, soft tissue, and skin: Secondary | ICD-10-CM

## 2024-05-26 DIAGNOSIS — L821 Other seborrheic keratosis: Secondary | ICD-10-CM | POA: Diagnosis not present

## 2024-05-26 DIAGNOSIS — D229 Melanocytic nevi, unspecified: Secondary | ICD-10-CM

## 2024-05-26 DIAGNOSIS — B079 Viral wart, unspecified: Secondary | ICD-10-CM

## 2024-05-26 DIAGNOSIS — L814 Other melanin hyperpigmentation: Secondary | ICD-10-CM

## 2024-05-26 DIAGNOSIS — D1801 Hemangioma of skin and subcutaneous tissue: Secondary | ICD-10-CM

## 2024-05-26 DIAGNOSIS — Z1283 Encounter for screening for malignant neoplasm of skin: Secondary | ICD-10-CM

## 2024-05-26 DIAGNOSIS — L578 Other skin changes due to chronic exposure to nonionizing radiation: Secondary | ICD-10-CM | POA: Diagnosis not present

## 2024-05-26 DIAGNOSIS — W908XXA Exposure to other nonionizing radiation, initial encounter: Secondary | ICD-10-CM | POA: Diagnosis not present

## 2024-05-26 DIAGNOSIS — D485 Neoplasm of uncertain behavior of skin: Secondary | ICD-10-CM

## 2024-05-26 NOTE — Progress Notes (Signed)
 New Patient Visit   Subjective  Dean Dennis is a 68 y.o. male who presents for the following: Skin Cancer Screening and Upper Body Skin Exam. Spots of left forehead and right temple. They have been there for years and are sore if he hits them. No history of skin cancer.  Accompanied by his sister today  The patient presents for Upper Body Skin Exam (UBSE) for skin cancer screening and mole check. The patient has spots, moles and lesions to be evaluated, some may be new or changing and the patient may have concern these could be cancer.    The following portions of the chart were reviewed this encounter and updated as appropriate: medications, allergies, medical history  Review of Systems:  No other skin or systemic complaints except as noted in HPI or Assessment and Plan.  Objective  Well appearing patient in no apparent distress; mood and affect are within normal limits.  All skin waist up examined. Relevant physical exam findings are noted in the Assessment and Plan.  Right Temple 1.0 cm brown verrucous nodule  Left Forehead 0.5 cm verrucous papule   Assessment & Plan   NEOPLASM OF UNCERTAIN BEHAVIOR OF SKIN (2) Right Temple Skin / nail biopsy Type of biopsy: tangential   Informed consent: discussed and consent obtained   Timeout: patient name, date of birth, surgical site, and procedure verified   Procedure prep:  Patient was prepped and draped in usual sterile fashion Prep type:  Isopropyl alcohol Anesthesia: the lesion was anesthetized in a standard fashion   Anesthetic:  1% lidocaine w/ epinephrine 1-100,000 buffered w/ 8.4% NaHCO3 Instrument used: flexible razor blade   Hemostasis achieved with: pressure, aluminum chloride and electrodesiccation   Outcome: patient tolerated procedure well   Post-procedure details: sterile dressing applied and wound care instructions given   Dressing type: bandage and petrolatum   Left Forehead Skin / nail biopsy Type of  biopsy: tangential   Informed consent: discussed and consent obtained   Timeout: patient name, date of birth, surgical site, and procedure verified   Procedure prep:  Patient was prepped and draped in usual sterile fashion Prep type:  Isopropyl alcohol Anesthesia: the lesion was anesthetized in a standard fashion   Anesthetic:  1% lidocaine w/ epinephrine 1-100,000 buffered w/ 8.4% NaHCO3 Instrument used: flexible razor blade   Hemostasis achieved with: pressure, aluminum chloride and electrodesiccation   Outcome: patient tolerated procedure well   Post-procedure details: sterile dressing applied and wound care instructions given   Dressing type: bandage and petrolatum   Skin cancer screening performed today.  Actinic Damage - Chronic condition, secondary to cumulative UV/sun exposure - diffuse scaly erythematous macules with underlying dyspigmentation - Recommend daily broad spectrum sunscreen SPF 30+ to sun-exposed areas, reapply every 2 hours as needed.  - Staying in the shade or wearing long sleeves, sun glasses (UVA+UVB protection) and wide brim hats (4-inch brim around the entire circumference of the hat) are also recommended for sun protection.  - Call for new or changing lesions.  Lentigines, Seborrheic Keratoses, Cherry angiomas- Benign normal skin lesions - Benign-appearing - Call for any changes  Melanocytic Nevi - Tan-brown and/or pink-flesh-colored symmetric macules and papules - Benign appearing on exam today - Observation - Call clinic for new or changing moles - Recommend daily use of broad spectrum spf 30+ sunscreen to sun-exposed areas.    Return if symptoms worsen or fail to improve.  I, Eliot Guernsey, CMA, am acting as scribe for Izel Hochberg K,  PA-C .   Documentation: I have reviewed the above documentation for accuracy and completeness, and I agree with the above.  Shealeigh Dunstan K, PA-C

## 2024-05-26 NOTE — Patient Instructions (Addendum)

## 2024-05-28 LAB — SURGICAL PATHOLOGY

## 2024-05-29 ENCOUNTER — Ambulatory Visit: Payer: Self-pay | Admitting: Physician Assistant

## 2024-06-26 ENCOUNTER — Encounter: Payer: Self-pay | Admitting: Internal Medicine

## 2024-07-25 ENCOUNTER — Emergency Department (HOSPITAL_COMMUNITY)

## 2024-07-25 ENCOUNTER — Observation Stay (HOSPITAL_COMMUNITY)
Admission: EM | Admit: 2024-07-25 | Discharge: 2024-07-26 | Disposition: A | Discharge status: Home / Self Care | Attending: Surgery | Admitting: Surgery

## 2024-07-25 ENCOUNTER — Other Ambulatory Visit: Payer: Self-pay

## 2024-07-25 ENCOUNTER — Encounter (HOSPITAL_COMMUNITY): Payer: Self-pay

## 2024-07-25 DIAGNOSIS — T185XXA Foreign body in anus and rectum, initial encounter: Principal | ICD-10-CM | POA: Diagnosis present

## 2024-07-25 DIAGNOSIS — I10 Essential (primary) hypertension: Secondary | ICD-10-CM | POA: Diagnosis not present

## 2024-07-25 DIAGNOSIS — W278XXA Contact with other nonpowered hand tool, initial encounter: Secondary | ICD-10-CM | POA: Diagnosis not present

## 2024-07-25 DIAGNOSIS — K605 Anorectal fistula, unspecified: Secondary | ICD-10-CM | POA: Insufficient documentation

## 2024-07-25 DIAGNOSIS — Z87821 Personal history of retained foreign body fully removed: Secondary | ICD-10-CM | POA: Insufficient documentation

## 2024-07-25 DIAGNOSIS — W19XXXA Unspecified fall, initial encounter: Secondary | ICD-10-CM | POA: Diagnosis not present

## 2024-07-25 DIAGNOSIS — K611 Rectal abscess: Secondary | ICD-10-CM

## 2024-07-25 DIAGNOSIS — Z7901 Long term (current) use of anticoagulants: Secondary | ICD-10-CM | POA: Diagnosis not present

## 2024-07-25 LAB — COMPREHENSIVE METABOLIC PANEL WITH GFR
ALT: 26 U/L (ref 0–44)
AST: 23 U/L (ref 15–41)
Albumin: 3.4 g/dL — ABNORMAL LOW (ref 3.5–5.0)
Alkaline Phosphatase: 70 U/L (ref 38–126)
Anion gap: 11 (ref 5–15)
BUN: 26 mg/dL — ABNORMAL HIGH (ref 8–23)
CO2: 26 mmol/L (ref 22–32)
Calcium: 8.7 mg/dL — ABNORMAL LOW (ref 8.9–10.3)
Chloride: 95 mmol/L — ABNORMAL LOW (ref 98–111)
Creatinine, Ser: 1.44 mg/dL — ABNORMAL HIGH (ref 0.61–1.24)
GFR, Estimated: 54 mL/min — ABNORMAL LOW (ref >60)
Glucose, Bld: 100 mg/dL — ABNORMAL HIGH (ref 70–99)
Potassium: 3.3 mmol/L — ABNORMAL LOW (ref 3.5–5.1)
Sodium: 132 mmol/L — ABNORMAL LOW (ref 135–145)
Total Bilirubin: 1.1 mg/dL (ref 0.0–1.2)
Total Protein: 6.6 g/dL (ref 6.5–8.1)

## 2024-07-25 LAB — CBC WITH DIFFERENTIAL/PLATELET
Abs Immature Granulocytes: 0.04 K/uL (ref 0.00–0.07)
Basophils Absolute: 0.1 K/uL (ref 0.0–0.1)
Basophils Relative: 1 %
Eosinophils Absolute: 0.3 K/uL (ref 0.0–0.5)
Eosinophils Relative: 2 %
HCT: 35.1 % — ABNORMAL LOW (ref 39.0–52.0)
Hemoglobin: 12.4 g/dL — ABNORMAL LOW (ref 13.0–17.0)
Immature Granulocytes: 0 %
Lymphocytes Relative: 9 %
Lymphs Abs: 1.2 K/uL (ref 0.7–4.0)
MCH: 34.3 pg — ABNORMAL HIGH (ref 26.0–34.0)
MCHC: 35.3 g/dL (ref 30.0–36.0)
MCV: 97.2 fL (ref 80.0–100.0)
Monocytes Absolute: 1.3 K/uL — ABNORMAL HIGH (ref 0.1–1.0)
Monocytes Relative: 11 %
Neutro Abs: 9.5 K/uL — ABNORMAL HIGH (ref 1.7–7.7)
Neutrophils Relative %: 77 %
Platelets: 241 K/uL (ref 150–400)
RBC: 3.61 MIL/uL — ABNORMAL LOW (ref 4.22–5.81)
RDW: 11.9 % (ref 11.5–15.5)
WBC: 12.5 K/uL — ABNORMAL HIGH (ref 4.0–10.5)
nRBC: 0 % (ref 0.0–0.2)

## 2024-07-25 MED ORDER — LISINOPRIL 10 MG PO TABS
10.0000 mg | ORAL_TABLET | Freq: Every day | ORAL | Status: DC
  Filled 2024-07-25: qty 1

## 2024-07-25 MED ORDER — POLYETHYLENE GLYCOL 3350 17 G PO PACK
17.0000 g | PACK | Freq: Every day | ORAL | Status: DC
  Administered 2024-07-26: 17 g via ORAL
  Filled 2024-07-25: qty 1

## 2024-07-25 MED ORDER — MORPHINE SULFATE (PF) 2 MG/ML IV SOLN: 2.0000 mg | INTRAVENOUS | Status: DC | PRN

## 2024-07-25 MED ORDER — OXYCODONE HCL 5 MG PO TABS
5.0000 mg | ORAL_TABLET | ORAL | Status: DC | PRN
  Administered 2024-07-26: 5 mg via ORAL
  Filled 2024-07-25: qty 1

## 2024-07-25 MED ORDER — LEVOTHYROXINE SODIUM 100 MCG PO TABS
100.0000 ug | ORAL_TABLET | Freq: Every day | ORAL | Status: DC
  Administered 2024-07-26: 100 ug via ORAL
  Filled 2024-07-25: qty 1

## 2024-07-25 MED ORDER — LORAZEPAM 2 MG/ML IJ SOLN
1.0000 mg | Freq: Once | INTRAMUSCULAR | Status: AC
  Administered 2024-07-25: 1 mg via INTRAVENOUS
  Filled 2024-07-25: qty 1

## 2024-07-25 MED ORDER — ASPIRIN 81 MG PO TBEC
81.0000 mg | DELAYED_RELEASE_TABLET | Freq: Every day | ORAL | Status: DC
  Administered 2024-07-26: 81 mg via ORAL
  Filled 2024-07-25: qty 1

## 2024-07-25 MED ORDER — POTASSIUM CHLORIDE IN NACL 20-0.9 MEQ/L-% IV SOLN
INTRAVENOUS | Status: DC
  Filled 2024-07-25: qty 1000

## 2024-07-25 MED ORDER — LISINOPRIL-HYDROCHLOROTHIAZIDE 10-12.5 MG PO TABS: 1.0000 | ORAL_TABLET | Freq: Every day | ORAL | Status: DC

## 2024-07-25 MED ORDER — ONDANSETRON HCL 4 MG/2ML IJ SOLN
4.0000 mg | Freq: Once | INTRAMUSCULAR | Status: AC
  Administered 2024-07-25: 4 mg via INTRAVENOUS
  Filled 2024-07-25: qty 2

## 2024-07-25 MED ORDER — HYDROCHLOROTHIAZIDE 12.5 MG PO TABS
12.5000 mg | ORAL_TABLET | Freq: Every day | ORAL | Status: DC
  Filled 2024-07-25: qty 1

## 2024-07-25 MED ORDER — ATORVASTATIN CALCIUM 10 MG PO TABS
20.0000 mg | ORAL_TABLET | Freq: Every day | ORAL | Status: DC
  Administered 2024-07-26: 20 mg via ORAL
  Filled 2024-07-25: qty 2

## 2024-07-25 MED ORDER — PIPERACILLIN-TAZOBACTAM 3.375 G IVPB 30 MIN
3.3750 g | Freq: Once | INTRAVENOUS | Status: AC
  Administered 2024-07-25: 3.375 g via INTRAVENOUS
  Filled 2024-07-25: qty 50

## 2024-07-25 MED ORDER — PIPERACILLIN-TAZOBACTAM 3.375 G IVPB 30 MIN: 3.3750 g | Freq: Three times a day (TID) | INTRAVENOUS | Status: DC

## 2024-07-25 MED ORDER — PIPERACILLIN-TAZOBACTAM 3.375 G IVPB
3.3750 g | Freq: Three times a day (TID) | INTRAVENOUS | Status: DC
  Administered 2024-07-26: 3.375 g via INTRAVENOUS
  Filled 2024-07-25: qty 50

## 2024-07-25 MED ORDER — HYDROMORPHONE HCL 1 MG/ML IJ SOLN
1.0000 mg | Freq: Once | INTRAMUSCULAR | Status: AC
  Administered 2024-07-25: 1 mg via INTRAVENOUS
  Filled 2024-07-25: qty 1

## 2024-07-25 MED ORDER — SENNA 8.6 MG PO TABS
1.0000 | ORAL_TABLET | Freq: Every day | ORAL | Status: DC
  Administered 2024-07-26: 8.6 mg via ORAL
  Filled 2024-07-25: qty 1

## 2024-07-25 MED ORDER — SODIUM CHLORIDE 0.9 % IV SOLN: INTRAVENOUS | Status: DC

## 2024-07-25 MED ORDER — ENOXAPARIN SODIUM 40 MG/0.4ML IJ SOSY
40.0000 mg | PREFILLED_SYRINGE | INTRAMUSCULAR | Status: DC
  Administered 2024-07-26: 40 mg via SUBCUTANEOUS
  Filled 2024-07-25: qty 0.4

## 2024-07-25 NOTE — H&P (Signed)
 Dean Dennis is an 68 y.o. male.   Chief Complaint: rectal foreign body HPI: 68yo M with past medical history of hypertension, hypothyroidism, and learning disability.  He inserted a screwdriver into his rectum, handle first 1 week ago.  He has not been able to get it out.  He notes that it formed a small wound on the right side of his anal opening.  The emergency room physician attempted to remove it but was unable to.  I was consulted for management.  Past Medical History:  Diagnosis Date   HTN (hypertension)    Hypothyroidism    Learning disability    evaluated in the past by psych borderline functioning range of inttelligence   Recurrent UTI 09/02/2013   Seizure ([facility]) 09/2005   saw neuro, they didnt think he had sz (-) EEG    Past Surgical History:  Procedure Laterality Date   TONSILLECTOMY     VASECTOMY  2004    Family History  Problem Relation Age of Onset   Heart attack Father 25   Lung cancer Mother        remote smoker   Prostate cancer Neg Hx    Colon cancer Neg Hx    Diabetes Neg Hx    Social History:  reports that he has never smoked. He has never used smokeless tobacco. He reports that he does not drink alcohol and does not use drugs.  Allergies: No Known Allergies  (Not in a hospital admission)   Results for orders placed or performed during the hospital encounter of 07/25/24 (from the past 48 hours)  CBC with Differential     Status: Abnormal   Collection Time: 07/25/24  7:10 PM  Result Value Ref Range   WBC 12.5 (H) 4.0 - 10.5 K/uL   RBC 3.61 (L) 4.22 - 5.81 MIL/uL   Hemoglobin 12.4 (L) 13.0 - 17.0 g/dL   HCT 64.8 (L) 60.9 - 47.9 %   MCV 97.2 80.0 - 100.0 fL   MCH 34.3 (H) 26.0 - 34.0 pg   MCHC 35.3 30.0 - 36.0 g/dL   RDW 88.0 88.4 - 84.4 %   Platelets 241 150 - 400 K/uL   nRBC 0.0 0.0 - 0.2 %   Neutrophils Relative % 77 %   Neutro Abs 9.5 (H) 1.7 - 7.7 K/uL   Lymphocytes Relative 9 %   Lymphs Abs 1.2 0.7 - 4.0 K/uL   Monocytes Relative 11 %    Monocytes Absolute 1.3 (H) 0.1 - 1.0 K/uL   Eosinophils Relative 2 %   Eosinophils Absolute 0.3 0.0 - 0.5 K/uL   Basophils Relative 1 %   Basophils Absolute 0.1 0.0 - 0.1 K/uL   Immature Granulocytes 0 %   Abs Immature Granulocytes 0.04 0.00 - 0.07 K/uL    Comment: Performed at [facility], [address] [place], [place] [address]  Comprehensive metabolic panel     Status: Abnormal   Collection Time: 07/25/24  7:10 PM  Result Value Ref Range   Sodium 132 (L) 135 - 145 mmol/L   Potassium 3.3 (L) 3.5 - 5.1 mmol/L   Chloride 95 (L) 98 - 111 mmol/L   CO2 26 22 - 32 mmol/L   Glucose, Bld 100 (H) 70 - 99 mg/dL    Comment: Glucose reference range applies only to samples taken after fasting for at least 8 hours.   BUN 26 (H) 8 - 23 mg/dL   Creatinine, Ser 8.55 (H) 0.61 - 1.24 mg/dL  Calcium  8.7 (L) 8.9 - 10.3 mg/dL   Total Protein 6.6 6.5 - 8.1 g/dL   Albumin 3.4 (L) 3.5 - 5.0 g/dL   AST 23 15 - 41 U/L   ALT 26 0 - 44 U/L   Alkaline Phosphatase 70 38 - 126 U/L   Total Bilirubin 1.1 0.0 - 1.2 mg/dL   GFR, Estimated 54 (L) >60 mL/min    Comment: (NOTE) Calculated using the CKD-EPI Creatinine Equation (2021)    Anion gap 11 5 - 15    Comment: Performed at [facility], [address] [place], [place] [address]   DG Pelvis Portable Result Date: 07/25/2024 EXAM: 1 or 2 VIEW(S) XRAY OF THE PELVIS 07/25/2024 07:23:00 PM COMPARISON: None available. CLINICAL HISTORY: Screw driver in the rectum. Reason for exam: pt put screw driver up rectum. FINDINGS: BONES AND JOINTS: No acute fracture. No focal osseous lesion. No joint dislocation. SOFT TISSUES: Metallic foreign body projects within the rectum with the tip oriented inferiorly. IMPRESSION: 1. Metallic foreign body, likely a screw driver, projects within the rectum with the tip oriented inferiorly. Electronically signed by: Franky Stanford MD 07/25/2024 07:28 PM EDT RP Workstation: [id number]    Review of Systems  Unable to perform  ROS: Other    Blood pressure 112/68, pulse 63, temperature 98.6 F (37 C), temperature source Axillary, resp. rate (!) 24, SpO2 100%. Physical Exam HENT:     Head: Normocephalic.     Mouth/Throat:     Mouth: Mucous membranes are moist.  Cardiovascular:     Rate and Rhythm: Normal rate and regular rhythm.  Pulmonary:     Effort: Pulmonary effort is normal.     Breath sounds: Normal breath sounds.  Abdominal:     General: Abdomen is flat. There is no distension.     Palpations: Abdomen is soft.     Tenderness: There is no abdominal tenderness. There is no guarding or rebound.  Genitourinary:    Comments: Small wound to the right of the anal opening with a palpable tip of the screwdriver.  Rectal exam reveals the remainder of the screwdriver in the rectum.  There was no bleeding.  I was able to gently withdraw the tip back into the rectum and remove the screwdriver from his rectum intact.  He tolerated this very well.  There is no evidence of cellulitis in the perianal area but this screwdriver tip has essentially formed a fistula in ano Neurological:     Mental Status: He is alert.      Assessment/Plan Retained rectal foreign body -I was able to remove this at the bedside.  The tip of the screwdriver, however, has essentially formed a false passage which is like a small fistula in ano.  There is no cellulitis in the area.  I feel it would be safest to admit him for some IV antibiotics and wound care.  We will watch for any infection.  I discussed the plan in detail with him and he is agreeable.  Dann FORBES Hummer, MD 07/25/2024, 9:55 PM

## 2024-07-25 NOTE — ED Provider Notes (Signed)
 [facility] Provider Note   CSN: [id number] Arrival date & time: 07/25/24  1906     Patient presents with: Foreign Body in Rectum   Dean Dennis is a 68 y.o. male history of hypertension, hypothyroidism here presenting with foreign body in the rectum.  Patient brought screwdriver into his rectum a week ago.  He states that he was unable to remove it.  He states that he tried of a bowel movement and realized that it created a false passageway.  Denies any previous history of foreign bodies in the rectum.   The history is provided by the patient.       Prior to Admission medications   Medication Sig Start Date End Date Taking? Authorizing Provider  aspirin 81 MG tablet Take 81 mg by mouth daily. Reported on 01/19/2016    [provider]  atorvastatin  (LIPITOR) 20 MG tablet Take 1 tablet (20 mg total) by mouth daily. 05/07/24   Paz, Jose E, MD  levothyroxine  (SYNTHROID ) 100 MCG tablet Take 1 tablet (100 mcg total) by mouth daily before breakfast. 05/15/24   Paz, Jose E, MD  lisinopril -hydrochlorothiazide  (ZESTORETIC ) 10-12.5 MG tablet Take 1 tablet by mouth daily. 05/07/24   Paz, Jose E, MD    Allergies: Patient has no known allergies.    Review of Systems  Skin:  Positive for wound.  All other systems reviewed and are negative.   Updated Vital Signs BP 112/68   Pulse 63   Temp 98.6 F (37 C) (Axillary)   Resp (!) 24   SpO2 100%   Physical Exam Vitals and nursing note reviewed.  Constitutional:      Appearance: Normal appearance.     Comments: Uncomfortable  HENT:     Head: Normocephalic.     Nose: Nose normal.     Mouth/Throat:     Mouth: Mucous membranes are moist.  Eyes:     Extraocular Movements: Extraocular movements intact.     Pupils: Pupils are equal, round, and reactive to light.  Cardiovascular:     Rate and Rhythm: Normal rate and regular rhythm.     Pulses: Normal pulses.     Heart sounds: Normal  heart sounds.  Pulmonary:     Effort: Pulmonary effort is normal.     Breath sounds: Normal breath sounds.  Abdominal:     General: Abdomen is flat.     Palpations: Abdomen is soft.  Genitourinary:    Comments: Patient has a screwdriver palpable on my rectal exam.  Patient also has a wound next to the rectum and I can palpate the screwdriver stuck in that wound.  There is surrounding cellulitis Musculoskeletal:     Cervical back: Normal range of motion and neck supple.  Skin:    General: Skin is warm.  Neurological:     General: No focal deficit present.     Mental Status: He is alert and oriented to person, place, and time.  Psychiatric:        Mood and Affect: Mood normal.        Behavior: Behavior normal.     (all labs ordered are listed, but only abnormal results are displayed) Labs Reviewed  CBC WITH DIFFERENTIAL/PLATELET - Abnormal; Notable for the following components:      Result Value   WBC 12.5 (*)    RBC 3.61 (*)    Hemoglobin 12.4 (*)    HCT 35.1 (*)    MCH 34.3 (*)  Neutro Abs 9.5 (*)    Monocytes Absolute 1.3 (*)    All other components within normal limits  COMPREHENSIVE METABOLIC PANEL WITH GFR - Abnormal; Notable for the following components:   Sodium 132 (*)    Potassium 3.3 (*)    Chloride 95 (*)    Glucose, Bld 100 (*)    BUN 26 (*)    Creatinine, Ser 1.44 (*)    Calcium  8.7 (*)    Albumin 3.4 (*)    GFR, Estimated 54 (*)    All other components within normal limits    EKG: None  Radiology: DG Pelvis Portable Result Date: 07/25/2024 EXAM: 1 or 2 VIEW(S) XRAY OF THE PELVIS 07/25/2024 07:23:00 PM COMPARISON: None available. CLINICAL HISTORY: Screw driver in the rectum. Reason for exam: pt put screw driver up rectum. FINDINGS: BONES AND JOINTS: No acute fracture. No focal osseous lesion. No joint dislocation. SOFT TISSUES: Metallic foreign body projects within the rectum with the tip oriented inferiorly. IMPRESSION: 1. Metallic foreign body,  likely a screw driver, projects within the rectum with the tip oriented inferiorly. Electronically signed by: Franky Stanford MD 07/25/2024 07:28 PM EDT RP Workstation: [id number]     Procedures   Diagnosis: Foreign Body - Location: rectum Procedure: Foreign body removal Type of extraction: simple  Informed consent:  Discussed the risks (permanent scarring, light or dark discoloration, infection, pain, bleeding, bruising, redness, blister formation, and recurrence of the lesion) and the benefits of the procedure, as well as the alternatives.  Informed consent was obtained. Anesthesia: none The area was prepared and draped in a standard fashion.  I was unable to remove the foreign body  Meticulous hemostasis was obtained with   Ointment and dressing were applied. The specimen was sent for pathologic examination.  The patient tolerated the procedure well. The patient was instructed on post-op care.     Medications Ordered in the ED  piperacillin-tazobactam (ZOSYN) IVPB 3.375 g (has no administration in time range)  0.9 % NaCl with KCl 20 mEq/ L  infusion (has no administration in time range)  HYDROmorphone (DILAUDID) injection 1 mg (1 mg Intravenous Given 07/25/24 1939)  LORazepam (ATIVAN) injection 1 mg (1 mg Intravenous Given 07/25/24 1941)  ondansetron (ZOFRAN) injection 4 mg (4 mg Intravenous Given 07/25/24 1937)                                    Medical Decision Making Dean Dennis is a 68 y.o. male here presenting with rectal foreign body.  Will attempt to remove the foreign body and get x-rays and lab  10:06 PM I was unable to remove the foreign body.  Talk to Dr. Sebastian from surgery.  He was able to remove the foreign body and since the foreign body created a false passage with cellulitis, he recommends Zosyn and he will admit the patient for IV antibiotics   Problems Addressed: Foreign body of rectum, initial encounter: acute illness or injury Perirectal cellulitis:  acute illness or injury  Amount and/or Complexity of Data Reviewed Labs: ordered. Decision-making details documented in ED Course. Radiology: ordered and independent interpretation performed. Decision-making details documented in ED Course.  Risk Prescription drug management. Decision regarding hospitalization.     Final diagnoses:  None    ED Discharge Orders     None          Patt Alm Macho, MD 07/25/24 2207

## 2024-07-25 NOTE — ED Triage Notes (Signed)
 Pt bibgcems from home. Pt states he has a screwdriver in his rectum that he willingly placed 7 days ago. Pt states he has abd pain and leakage from his rectum. Pt c/o 5/10 pain in his rectum at this time

## 2024-07-26 DIAGNOSIS — T185XXA Foreign body in anus and rectum, initial encounter: Secondary | ICD-10-CM | POA: Diagnosis not present

## 2024-07-26 LAB — BASIC METABOLIC PANEL WITH GFR
Anion gap: 12 (ref 5–15)
BUN: 23 mg/dL (ref 8–23)
CO2: 22 mmol/L (ref 22–32)
Calcium: 8.6 mg/dL — ABNORMAL LOW (ref 8.9–10.3)
Chloride: 100 mmol/L (ref 98–111)
Creatinine, Ser: 1.35 mg/dL — ABNORMAL HIGH (ref 0.61–1.24)
GFR, Estimated: 58 mL/min — ABNORMAL LOW (ref >60)
Glucose, Bld: 94 mg/dL (ref 70–99)
Potassium: 4 mmol/L (ref 3.5–5.1)
Sodium: 134 mmol/L — ABNORMAL LOW (ref 135–145)

## 2024-07-26 LAB — CBC
HCT: 34 % — ABNORMAL LOW (ref 39.0–52.0)
Hemoglobin: 11.8 g/dL — ABNORMAL LOW (ref 13.0–17.0)
MCH: 34 pg (ref 26.0–34.0)
MCHC: 34.7 g/dL (ref 30.0–36.0)
MCV: 98 fL (ref 80.0–100.0)
Platelets: 249 K/uL (ref 150–400)
RBC: 3.47 MIL/uL — ABNORMAL LOW (ref 4.22–5.81)
RDW: 11.9 % (ref 11.5–15.5)
WBC: 10.7 K/uL — ABNORMAL HIGH (ref 4.0–10.5)
nRBC: 0 % (ref 0.0–0.2)

## 2024-07-26 LAB — HIV ANTIBODY (ROUTINE TESTING W REFLEX): HIV Screen 4th Generation wRfx: NONREACTIVE

## 2024-07-26 MED ORDER — AMOXICILLIN-POT CLAVULANATE 875-125 MG PO TABS: 1.0000 | ORAL_TABLET | Freq: Two times a day (BID) | ORAL | 0 refills | Status: AC

## 2024-07-26 NOTE — Discharge Summary (Signed)
 [facility] Discharge Summary   Patient ID: Dean Dennis MRN: [id number] DOB/AGE: January 02, 1956 68 y.o.  Admit date: 07/25/2024 Discharge date: 07/26/2024  Admitting Diagnosis: Rectal foreign body  Discharge Diagnosis S/p removal of rectal foreign body  Small anorectal fistula  Consultants None   Imaging: DG Pelvis Portable Result Date: 07/25/2024 EXAM: 1 or 2 VIEW(S) XRAY OF THE PELVIS 07/25/2024 07:23:00 PM COMPARISON: None available. CLINICAL HISTORY: Screw driver in the rectum. Reason for exam: pt put screw driver up rectum. FINDINGS: BONES AND JOINTS: No acute fracture. No focal osseous lesion. No joint dislocation. SOFT TISSUES: Metallic foreign body projects within the rectum with the tip oriented inferiorly. IMPRESSION: 1. Metallic foreign body, likely a screw driver, projects within the rectum with the tip oriented inferiorly. Electronically signed by: Dean Stanford MD 07/25/2024 07:28 PM EDT RP Workstation: [id number]    Procedures None   Hospital Course:  Patient is a 68 year old male who presented to the ED with screwdriver in rectum x1 week. Screwdriver was able to be removed at bedside but patient appears to have developed a false tract from this which is essentially a small fistula in ano. Discussed with colorectal surgery and would recommend sitz baths and PO antibiotics but safe to discharge home if tolerating diet and pain controlled. Patient should return to ED if concern for infection, return precautions reviewed with patient and should avoid further rectal trauma acutely. Recommended against reinsertion of screwdriver or other household items. Patient verbalized understanding.   Physical Exam: General:  Alert, NAD, pleasant, comfortable Abd:  Soft, ND, NT GU: small wound to right of anus without cellulitis, scant bloody drainage  I or a member of my team have reviewed this patient in the Controlled Substance Database.   Allergies as of 07/26/2024   No  Known Allergies      Medication List     TAKE these medications    amoxicillin -clavulanate 875-125 MG tablet Commonly known as: AUGMENTIN  Take 1 tablet by mouth 2 (two) times daily for 7 days.   aspirin  81 MG tablet Take 81 mg by mouth daily as needed for pain. Reported on 01/19/2016   atorvastatin  20 MG tablet Commonly known as: LIPITOR Take 1 tablet (20 mg total) by mouth daily.   levothyroxine  100 MCG tablet Commonly known as: SYNTHROID  Take 1 tablet (100 mcg total) by mouth daily before breakfast.   lisinopril -hydrochlorothiazide  10-12.5 MG tablet Commonly known as: ZESTORETIC  Take 1 tablet by mouth daily.          Follow-up Information     Amon Aloysius BRAVO, MD. Call.   Specialty: Internal Medicine Why: As needed Contact information: [address] [place] [address] [phone]                 Signed: Burnard JONELLE Louder , [facility] 07/26/2024, 9:20 AM Please see Amion for pager number during day hours 7:00am-4:30pm

## 2024-07-26 NOTE — Progress Notes (Signed)
 Dean Dennis to be D/C'd  per MD order.  Discussed with the patient and all questions fully answered.  VSS, Skin clean, dry and intact without evidence of skin break down, no evidence of skin tears noted.  IV catheter discontinued intact. Site without signs and symptoms of complications. Dressing and pressure applied.  An After Visit Summary was printed and given to the patient. Patient instructed to pick up prescription from preferred pharmacy.  D/c education completed with patient/family including follow up instructions, medication list, d/c activities limitations if indicated, with other d/c instructions as indicated by MD - patient able to verbalize understanding, all questions fully answered.   Patient instructed to return to ED, call 911, or call MD for any changes in condition.   Patient to be escorted to front entrance ambulatory, and D/C home via private auto.

## 2024-07-26 NOTE — Plan of Care (Signed)
  Problem: Education: Goal: Knowledge of General Education information will improve Description: Including pain rating scale, medication(s)/side effects and non-pharmacologic comfort measures Outcome: Progressing   Problem: Clinical Measurements: Goal: Ability to maintain clinical measurements within normal limits will improve Outcome: Progressing   Problem: Activity: Goal: Risk for activity intolerance will decrease Outcome: Progressing   Problem: Nutrition: Goal: Adequate nutrition will be maintained Outcome: Progressing   Problem: Pain Managment: Goal: General experience of comfort will improve and/or be controlled Outcome: Progressing

## 2024-07-26 NOTE — Plan of Care (Signed)

## 2024-07-26 NOTE — Progress Notes (Signed)
 Spoke with Verneita Menghini, pt's sister and guardian about discharge and pick up. She stated she could be here approx 2 p.m. to pick up pt.

## 2024-07-26 NOTE — Progress Notes (Signed)
 Held BP meds d/t low BP  07/26/24 0752  Vitals  Temp 97.6 F (36.4 C)  Temp Source Oral  BP 99/68  MAP (mmHg) 78  BP Location Right Arm  BP Method Automatic  Patient Position (if appropriate) Lying  Pulse Rate (!) 56  Pulse Rate Source Monitor  Resp 16  MEWS COLOR  MEWS Score Color Green  Oxygen Therapy  SpO2 97 %  O2 Device Room Air  MEWS Score  MEWS Temp 0  MEWS Systolic 1  MEWS Pulse 0  MEWS RR 0  MEWS LOC 0  MEWS Score 1

## 2024-07-26 NOTE — Care Management Obs Status (Signed)
 MEDICARE OBSERVATION STATUS NOTIFICATION   Patient Details  Name: Dean Dennis MRN: [id number] Date of Birth: 16-Aug-1956   Medicare Observation Status Notification Given:  Yes    Robynn Eileen Hoose, RN 07/26/2024, 9:37 AM

## 2024-08-09 ENCOUNTER — Other Ambulatory Visit: Payer: Self-pay | Admitting: Internal Medicine

## 2024-09-26 ENCOUNTER — Ambulatory Visit: Admitting: Internal Medicine

## 2024-09-26 ENCOUNTER — Encounter: Payer: Self-pay | Admitting: Internal Medicine

## 2024-09-26 VITALS — BP 116/66 | HR 63 | Temp 98.1°F | Resp 16 | Ht 63.0 in | Wt 156.4 lb

## 2024-09-26 DIAGNOSIS — E034 Atrophy of thyroid (acquired): Secondary | ICD-10-CM | POA: Diagnosis not present

## 2024-09-26 DIAGNOSIS — I10 Essential (primary) hypertension: Secondary | ICD-10-CM

## 2024-09-26 DIAGNOSIS — K6289 Other specified diseases of anus and rectum: Secondary | ICD-10-CM

## 2024-09-26 DIAGNOSIS — F819 Developmental disorder of scholastic skills, unspecified: Secondary | ICD-10-CM | POA: Diagnosis not present

## 2024-09-26 DIAGNOSIS — T185XXS Foreign body in anus and rectum, sequela: Secondary | ICD-10-CM

## 2024-09-26 LAB — CBC WITH DIFFERENTIAL/PLATELET
Basophils Absolute: 0.1 K/uL (ref 0.0–0.1)
Basophils Relative: 1.8 % (ref 0.0–3.0)
Eosinophils Absolute: 0.4 K/uL (ref 0.0–0.7)
Eosinophils Relative: 7.1 % — ABNORMAL HIGH (ref 0.0–5.0)
HCT: 38.3 % — ABNORMAL LOW (ref 39.0–52.0)
Hemoglobin: 13.2 g/dL (ref 13.0–17.0)
Lymphocytes Relative: 30.3 % (ref 12.0–46.0)
Lymphs Abs: 1.7 K/uL (ref 0.7–4.0)
MCHC: 34.5 g/dL (ref 30.0–36.0)
MCV: 100.9 fl — ABNORMAL HIGH (ref 78.0–100.0)
Monocytes Absolute: 0.6 K/uL (ref 0.1–1.0)
Monocytes Relative: 11.4 % (ref 3.0–12.0)
Neutro Abs: 2.7 K/uL (ref 1.4–7.7)
Neutrophils Relative %: 49.4 % (ref 43.0–77.0)
Platelets: 243 K/uL (ref 150.0–400.0)
RBC: 3.8 Mil/uL — ABNORMAL LOW (ref 4.22–5.81)
RDW: 14.3 % (ref 11.5–15.5)
WBC: 5.5 K/uL (ref 4.0–10.5)

## 2024-09-26 LAB — TSH: TSH: 0.06 u[IU]/mL — ABNORMAL LOW (ref 0.35–5.50)

## 2024-09-26 NOTE — Patient Instructions (Signed)
 GO TO THE LAB :  Get the blood work    Then, go to the front desk for the checkout Please make an appointment for a  physical exam in 6 months

## 2024-09-26 NOTE — Assessment & Plan Note (Signed)
 HTN: No ambulatory BPs, on Zestoretic , last creatinine satisfactory.  No change Hyperlipidemia: Well-controlled on atorvastatin  Foreign body rectum: Admitted to hospital and discharged 07/26/2024: He was admitted with a screwdriver in the rectum for 1 week, question of a small fistula in ano, after 1 day of observation surgery recommended conservative treatment with antibiotics, sitz bath's and discharged home. States that from time to time he introduces a foreign body to the rectum, strongly recommend to stop that practice. Since 07/25/2024 is having some discomfort with BMs, DRE anoscopy performed today: Benign.  Recommend observation. Preventive care: We left a message to the patient's POA ([name]) to see if she allow us  to give us  a flu shot and a pneumonia shot. He also needs a COVID-vaccine, shingles shot and CCS.  Personal preference for patient is a stool test. Sent her a MyChart message with above information.  RTC RTC 6 months

## 2024-09-26 NOTE — Progress Notes (Signed)
   Subjective:    Patient ID: Dean Dennis, male    DOB: [birth year], 68 y.o.   MRN: [id number]  DOS:  09/26/2024 Type of visit - description: Follow-up  Went to hospital, chart reviewed, he inserted a screwdriver in his anus. Was treated conservatively and released home. When asked, he admits to mild pain when he has a BM.  Denies any bleeding, no swelling.   Review of Systems See above   Past Medical History:  Diagnosis Date   HTN (hypertension)    Hypothyroidism    Learning disability    evaluated in the past by psych borderline functioning range of inttelligence   Recurrent UTI 09/02/2013   Seizure (HCC) 09/2005   saw neuro, they didnt think he had sz (-) EEG    Past Surgical History:  Procedure Laterality Date   TONSILLECTOMY     VASECTOMY  2004    Current Outpatient Medications  Medication Instructions   aspirin  81 mg, Daily PRN   atorvastatin  (LIPITOR) 20 mg, Oral, Daily   levothyroxine  (SYNTHROID ) 100 mcg, Oral, Daily before breakfast   lisinopril -hydrochlorothiazide  (ZESTORETIC ) 10-12.5 MG tablet 1 tablet, Oral, Daily       Objective:   Physical Exam BP 116/66   Pulse 63   Temp 98.1 F (36.7 C) (Oral)   Resp 16   Ht 5' 3 (1.6 m)   Wt 156 lb 6 oz (70.9 kg)   SpO2 99%   BMI 27.70 kg/m  General:   Well developed, NAD, BMI noted. HEENT:  Normocephalic . Face symmetric, atraumatic DRE: Normal prostate, no mass, no stools found Anoscopy: With a consent of the patient I introduced self lighted well-lubricated anoscope he has internal hemorrhoids, no stools found, no opening or discharge seen. Lower extremities: no pretibial edema bilaterally  Skin: Not pale. Not jaundice Neurologic:  alert & oriented X3.  Speech normal, gait appropriate for age and unassisted Psych--  Cognition and judgment appear intact.  Cooperative with normal attention span and concentration.  Behavior appropriate. No anxious or depressed appearing.      Assessment     Assessment HTN Hyperlipidemia  Hypothyroidism Recurrent UTIs last visit with urology 2012. Previous eval: CT 2010 bilateral renal cysts; cysto 2009: Unremarkable Increase MCV, normal folic acid  and B12 Learning disability ?Seizure, 2006 >>> saw neurology, EEG negative.not likely SZ FH CAD , father MI age 23   PLAN HTN: No ambulatory BPs, on Zestoretic , last creatinine satisfactory.  No change Hyperlipidemia: Well-controlled on atorvastatin  Foreign body rectum: Admitted to hospital and discharged 07/26/2024: He was admitted with a screwdriver in the rectum for 1 week, question of a small fistula in ano, after 1 day of observation surgery recommended conservative treatment with antibiotics, sitz bath's and discharged home. States that from time to time he introduces a foreign body to the rectum, strongly recommend to stop that practice. Since 07/25/2024 is having some discomfort with BMs, DRE anoscopy performed today: Benign.  Recommend observation. Preventive care: We left a message to the patient's POA ([name]) to see if she allow us  to give us  a flu shot and a pneumonia shot. He also needs a COVID-vaccine, shingles shot and CCS.  Personal preference for patient is a stool test. Sent her a MyChart message with above information.  RTC RTC 6 months

## 2024-09-29 ENCOUNTER — Ambulatory Visit: Payer: Self-pay | Admitting: Internal Medicine

## 2024-09-29 DIAGNOSIS — E034 Atrophy of thyroid (acquired): Secondary | ICD-10-CM

## 2024-09-29 MED ORDER — LEVOTHYROXINE SODIUM 88 MCG PO TABS: 88.0000 ug | ORAL_TABLET | Freq: Every day | ORAL | 0 refills | Status: AC

## 2024-10-01 ENCOUNTER — Other Ambulatory Visit: Payer: Self-pay | Admitting: Internal Medicine

## 2024-10-06 NOTE — Progress Notes (Signed)
 Dean Dennis                                          MRN: [id number]   10/06/2024   The [facility] Quality Team Specialist reviewed this patient medical record for the purposes of chart review for care gap closure. The following were reviewed: chart review for care gap closure-colorectal cancer screening.    [facility] Quality Team

## 2024-10-30 ENCOUNTER — Other Ambulatory Visit: Payer: Self-pay | Admitting: Internal Medicine

## 2024-11-13 ENCOUNTER — Telehealth: Payer: Self-pay

## 2024-11-13 NOTE — Telephone Encounter (Signed)
 Copied from CRM [id number]. Topic: Clinical - Request for Lab/Test Order >> Nov 13, 2024  4:03 PM Terri MATSU wrote: Reason for CRM: Patient would like to get his  shingles and Pneumonia shot . So can Dr.Paz order him that for him and also Dr.Paz did advised for him to get his flu shot as well. Can patient get all of them done at the same time or different days? Callback number [phone]

## 2024-11-14 NOTE — Telephone Encounter (Signed)
 Left detailed message on machine to call back to schedule nv and to come with pt and that also that he needs to get shingles vaccine at pharmacy.

## 2024-11-14 NOTE — Telephone Encounter (Signed)
 Edison- I'm at [place] today, can you try calling Pt's guardian- sister Verneita, her number is [phone]. Dr. Amon has previously given okay to do Prevnar 20 and flu shot, however we weren't able to get Verneita on the phone at his last visit on 09/26/24. If Verneita will give us  okay to give those two vaccines to him we can. However, as you know shingrix will need to be done at pharmacy. She will need to come with him at time of nurse visit and see if she will bring a copy of his guardianship papers for the chart. Thank you

## 2025-01-08 ENCOUNTER — Other Ambulatory Visit

## 2025-01-08 ENCOUNTER — Ambulatory Visit: Admitting: *Deleted

## 2025-01-08 VITALS — BP 115/74 | HR 63 | Temp 97.9°F | Resp 18 | Ht 63.0 in | Wt 162.0 lb

## 2025-01-08 DIAGNOSIS — Z0100 Encounter for examination of eyes and vision without abnormal findings: Secondary | ICD-10-CM

## 2025-01-08 DIAGNOSIS — E034 Atrophy of thyroid (acquired): Secondary | ICD-10-CM | POA: Diagnosis not present

## 2025-01-08 DIAGNOSIS — Z23 Encounter for immunization: Secondary | ICD-10-CM | POA: Diagnosis not present

## 2025-01-08 DIAGNOSIS — Z0000 Encounter for general adult medical examination without abnormal findings: Secondary | ICD-10-CM | POA: Diagnosis not present

## 2025-01-08 DIAGNOSIS — Z1211 Encounter for screening for malignant neoplasm of colon: Secondary | ICD-10-CM

## 2025-01-08 LAB — TSH: TSH: 3.07 u[IU]/mL (ref 0.35–5.50)

## 2025-01-08 NOTE — Progress Notes (Signed)
 " Please attest this visit in the absence of patient primary care provider.   Chief Complaint  Patient presents with   Medicare Wellness     Subjective:   Dean Dennis is a 69 y.o. male who presents for a Medicare Annual Wellness Visit.  Visit info / Clinical Intake: Medicare Wellness Visit Type:: Initial Annual Wellness Visit Persons participating in visit and providing information:: patient Medicare Wellness Visit Mode:: In-person (required for WTM) Interpreter Needed?: No Pre-visit prep was completed: yes AWV questionnaire completed by patient prior to visit?: no Living arrangements:: (!) lives alone Patient's Overall Health Status Rating: excellent Typical amount of pain: none Does pain affect daily life?: no Are you currently prescribed opioids?: no  Dietary Habits and Nutritional Risks How many meals a day?: 3 Eats fruit and vegetables daily?: yes Most meals are obtained by: preparing own meals In the last 2 weeks, have you had any of the following?: none Diabetic:: no  Functional Status Activities of Daily Living (to include ambulation/medication): Independent Ambulation: Independent Medication Administration: Independent Home Management (perform basic housework or laundry): Independent Manage your own finances?: (!) no (sisters help) Primary transportation is: family / friends Concerns about vision?: no *vision screening is required for WTM* (has appt with [facility] in May) Concerns about hearing?: no  Fall Screening Falls in the past year?: 0 Number of falls in past year: 0 Was there an injury with Fall?: 0 Fall Risk Category Calculator: 0 Patient Fall Risk Level: Low Fall Risk  Fall Risk Patient at Risk for Falls Due to: No Fall Risks Fall risk Follow up: Falls evaluation completed  Home and Transportation Safety: All rugs have non-skid backing?: (!) no (in front of fireplace) All stairs or steps have railings?: N/A, no stairs Grab bars in the  bathtub or shower?: (!) no Have non-skid surface in bathtub or shower?: (!) no Good home lighting?: yes Regular seat belt use?: yes Hospital stays in the last year:: (!) yes How many hospital stays:: 1  Cognitive Assessment Difficulty concentrating, remembering, or making decisions? : no Will 6CIT or Mini Cog be Completed: yes What year is it?: 0 points What month is it?: 0 points Give patient an address phrase to remember (5 components): [address], [place]  About what time is it?: 0 points Count backwards from 20 to 1: 0 points Say the months of the year in reverse: 0 points Repeat the address phrase from earlier: 2 points 6 CIT Score: 2 points  Advance Directives (For Healthcare) Does Patient Have a Medical Advance Directive?: Yes Does patient want to make changes to medical advance directive?: No - Patient declined Type of Advance Directive: Healthcare Power of [employer] of Healthcare Power of Attorney in Chart?: No - copy requested  Reviewed/Updated  Reviewed/Updated: Reviewed All (Medical, Surgical, Family, Medications, Allergies, Care Teams, Patient Goals)    Allergies (verified) Patient has no known allergies.   Current Medications (verified) Outpatient Encounter Medications as of 01/08/2025  Medication Sig   aspirin  81 MG tablet Take 81 mg by mouth daily as needed for pain. Reported on 01/19/2016   atorvastatin  (LIPITOR) 20 MG tablet Take 1 tablet (20 mg total) by mouth daily.   levothyroxine  (SYNTHROID ) 88 MCG tablet Take 1 tablet (88 mcg total) by mouth daily before breakfast.   lisinopril -hydrochlorothiazide  (ZESTORETIC ) 10-12.5 MG tablet Take 1 tablet by mouth daily.   No facility-administered encounter medications on file as of 01/08/2025.    History: Past Medical History:  Diagnosis  Date   HTN (hypertension)    Hypothyroidism    Learning disability    evaluated in the past by psych borderline functioning range of inttelligence    Recurrent UTI 09/02/2013   Seizure (HCC) 09/2005   saw neuro, they didnt think he had sz (-) EEG   Past Surgical History:  Procedure Laterality Date   TONSILLECTOMY     VASECTOMY  2004   Family History  Problem Relation Age of Onset   Heart attack Father 54   Lung cancer Mother        remote smoker   Prostate cancer Neg Hx    Colon cancer Neg Hx    Diabetes Neg Hx    Social History   Occupational History   Occupation: disability  Tobacco Use   Smoking status: Never   Smokeless tobacco: Never  Substance and Sexual Activity   Alcohol use: No   Drug use: No   Sexual activity: Not on file   Tobacco Counseling Counseling given: Not Answered  SDOH Screenings   Food Insecurity: No Food Insecurity (01/08/2025)  Housing: Low Risk (01/08/2025)  Transportation Needs: No Transportation Needs (01/08/2025)  Utilities: Not At Risk (01/08/2025)  Depression (PHQ2-9): Low Risk (01/08/2025)  Physical Activity: Insufficiently Active (01/08/2025)  Social Connections: Moderately Isolated (01/08/2025)  Stress: No Stress Concern Present (01/08/2025)  Tobacco Use: Low Risk (01/08/2025)   See flowsheets for full screening details  Depression Screen PHQ 2 & 9 Depression Scale- Over the past 2 weeks, how often have you been bothered by any of the following problems? Little interest or pleasure in doing things: 0 Feeling down, depressed, or hopeless (PHQ Adolescent also includes...irritable): 0 PHQ-2 Total Score: 0 Trouble falling or staying asleep, or sleeping too much: 0 Feeling tired or having little energy: 0 Poor appetite or overeating (PHQ Adolescent also includes...weight loss): 0 Feeling bad about yourself - or that you are a failure or have let yourself or your family down: 0 Trouble concentrating on things, such as reading the newspaper or watching television (PHQ Adolescent also includes...like school work): 0 Moving or speaking so slowly that other people could have noticed. Or the  opposite - being so fidgety or restless that you have been moving around a lot more than usual: 0 Thoughts that you would be better off dead, or of hurting yourself in some way: 0 PHQ-9 Total Score: 0 If you checked off any problems, how difficult have these problems made it for you to do your work, take care of things at home, or get along with other people?: Not difficult at all     Goals Addressed               This Visit's Progress     <enter goal here> (pt-stated)   On track     Keep riding bike and drink more water.             Objective:    Today's Vitals   01/08/25 0902  BP: 115/74  Pulse: 63  Resp: 18  Temp: 97.9 F (36.6 C)  TempSrc: Oral  SpO2: 100%  Weight: 162 lb (73.5 kg)  Height: 5' 3 (1.6 m)   Body mass index is 28.7 kg/m.  Hearing/Vision screen No results found. Immunizations and Health Maintenance Health Maintenance  Topic Date Due   Zoster Vaccines- Shingrix (1 of 2) 11/01/1977   COVID-19 Vaccine (3 - Pfizer risk series) 05/14/2020   Colonoscopy  11/27/2023   Medicare Annual Wellness (AWV)  01/08/2026   DTaP/Tdap/Td (3 - Tdap) 10/01/2028   Pneumococcal Vaccine: 50+ Years  Completed   Influenza Vaccine  Completed   Hepatitis C Screening  Completed   Meningococcal B Vaccine  Aged Out        Assessment/Plan:  This is a routine wellness examination for Phil Campbell.  Patient Care Team: Amon Aloysius BRAVO, MD as PCP - General Pyrtle, Gordy HERO, MD as Consulting Physician (Gastroenterology) [facility]  I have personally reviewed and noted the following in the patients chart:   Medical and social history Use of alcohol, tobacco or illicit drugs  Current medications and supplements including opioid prescriptions. Functional ability and status Nutritional status Physical activity Advanced directives List of other physicians Hospitalizations, surgeries, and ER visits in previous 12 months Vitals Screenings to include  cognitive, depression, and falls Referrals and appointments  Orders Placed This Encounter  Procedures   Pneumococcal conjugate vaccine 20-valent (Prevnar 20)   Flu vaccine HIGH DOSE PF(Fluzone Trivalent)   Cologuard    Standing Status:   Future    Expected Date:   01/08/2025    Expiration Date:   01/08/2026   Ambulatory referral to Ophthalmology    Referral Priority:   Routine    Referral Type:   Consultation    Referral Reason:   Specialty Services Required    Requested Specialty:   Ophthalmology    Number of Visits Requested:   1   In addition, I have reviewed and discussed with patient certain preventive protocols, quality metrics, and best practice recommendations. A written personalized care plan for preventive services as well as general preventive health recommendations were provided to patient.   Lolita Libra, CMA   01/08/2025   Return in 1 year (on 01/08/2026).  After Visit Summary: (In Person-Printed) AVS printed and given to the patient  Nurse Notes: HM Addressed: Vaccines Given today: Flu, PCV 20 Cologuard Ordered  "

## 2025-01-08 NOTE — Patient Instructions (Addendum)
 Dean Dennis,  Thank you for taking the time for your Medicare Wellness Visit. I appreciate your continued commitment to your health goals. Please review the care plan we discussed, and feel free to reach out if I can assist you further.  Please note that Annual Wellness Visits do not include a physical exam. Some assessments may be limited, especially if the visit was conducted virtually. If needed, we may recommend an in-person follow-up with your provider.  Ongoing Care Seeing your primary care provider every 3 to 6 months helps us  monitor your health and provide consistent, personalized care.   Dr Amon:  03/30/25 9am Medicare AWV: 01/12/26 9am, in person  Referrals If a referral was made during today's visit and you haven't received any updates within two weeks, please contact the referred provider directly to check on the status.  Cologuard was ordered today:  Please let us  know if you don't receive your kit in the mail within 2 weeks [facility]:  referral was placed today  Recommended Screenings: You will need to get the following vaccines at your local pharmacy: Shingles, Covid  Health Maintenance  Topic Date Due   Medicare Annual Wellness Visit  Never done   Zoster (Shingles) Vaccine (1 of 2) 11/01/1977   COVID-19 Vaccine (3 - Pfizer risk series) 05/14/2020   Colon Cancer Screening  11/27/2023   DTaP/Tdap/Td vaccine (3 - Tdap) 10/01/2028   Pneumococcal Vaccine for age over 17  Completed   Flu Shot  Completed   Hepatitis C Screening  Completed   Meningitis B Vaccine  Aged Out       01/08/2025    9:05 AM  Advanced Directives  Does Patient Have a Medical Advance Directive? Yes  Type of Advance Directive Healthcare Power of Attorney  Does patient want to make changes to medical advance directive? No - Patient declined  Copy of Healthcare Power of Attorney in Chart? No - copy requested   Bring a copy of your health care power of attorney and living will to the office to be  added to your chart at your convenience. You can mail a copy to [facility] [address] [place], [place] [address] or email to [email or web address]   Vision: Annual vision screenings are recommended for early detection of glaucoma, cataracts, and diabetic retinopathy. These exams can also reveal signs of chronic conditions such as diabetes and high blood pressure.  Dental: Annual dental screenings help detect early signs of oral cancer, gum disease, and other conditions linked to overall health, including heart disease and diabetes.  Please see the attached documents for additional preventive care recommendations.

## 2025-01-09 ENCOUNTER — Ambulatory Visit: Payer: Self-pay | Admitting: Internal Medicine

## 2025-03-30 ENCOUNTER — Encounter: Admitting: Internal Medicine

## 2026-01-12 ENCOUNTER — Ambulatory Visit
